# Patient Record
Sex: Male | Born: 1940 | Race: White | Hispanic: No | Marital: Married | Smoking: Never smoker
Health system: Southern US, Community
[De-identification: ages and names within clinical notes are randomized; demographics above are authoritative.]

## PROBLEM LIST (undated history)

## (undated) DIAGNOSIS — T4145XA Adverse effect of unspecified anesthetic, initial encounter: Secondary | ICD-10-CM

## (undated) DIAGNOSIS — Z952 Presence of prosthetic heart valve: Secondary | ICD-10-CM

## (undated) DIAGNOSIS — I350 Nonrheumatic aortic (valve) stenosis: Secondary | ICD-10-CM

## (undated) DIAGNOSIS — H269 Unspecified cataract: Secondary | ICD-10-CM

## (undated) DIAGNOSIS — T8859XA Other complications of anesthesia, initial encounter: Secondary | ICD-10-CM

## (undated) HISTORY — DX: Nonrheumatic aortic (valve) stenosis: I35.0

## (undated) HISTORY — DX: Unspecified cataract: H26.9

## (undated) HISTORY — PX: HERNIA REPAIR: SHX51

## (undated) HISTORY — PX: TONSILLECTOMY: SUR1361

---

## 1999-10-03 ENCOUNTER — Ambulatory Visit (HOSPITAL_BASED_OUTPATIENT_CLINIC_OR_DEPARTMENT_OTHER): Admission: RE | Admit: 1999-10-03 | Discharge: 1999-10-03 | Payer: Self-pay | Admitting: Plastic Surgery

## 2002-10-31 ENCOUNTER — Ambulatory Visit (HOSPITAL_BASED_OUTPATIENT_CLINIC_OR_DEPARTMENT_OTHER): Admission: RE | Admit: 2002-10-31 | Discharge: 2002-10-31 | Payer: Self-pay | Admitting: Plastic Surgery

## 2003-05-05 HISTORY — PX: COLONOSCOPY: SHX174

## 2013-05-04 HISTORY — PX: CATARACT EXTRACTION: SUR2

## 2013-05-04 HISTORY — PX: ELECTROCARDIOGRAM: SHX264

## 2013-08-28 ENCOUNTER — Other Ambulatory Visit (HOSPITAL_COMMUNITY): Payer: Self-pay | Admitting: Family Medicine

## 2013-08-28 ENCOUNTER — Encounter: Payer: Self-pay | Admitting: Internal Medicine

## 2013-08-28 ENCOUNTER — Ambulatory Visit (HOSPITAL_COMMUNITY): Payer: Medicare Other | Attending: Internal Medicine | Admitting: Cardiology

## 2013-08-28 DIAGNOSIS — I359 Nonrheumatic aortic valve disorder, unspecified: Secondary | ICD-10-CM | POA: Insufficient documentation

## 2013-08-28 NOTE — Progress Notes (Signed)
Echo performed. 

## 2014-10-03 ENCOUNTER — Encounter: Payer: Self-pay | Admitting: Gastroenterology

## 2014-10-22 ENCOUNTER — Ambulatory Visit (AMBULATORY_SURGERY_CENTER): Payer: Self-pay

## 2014-10-22 VITALS — Ht 67.0 in | Wt 153.0 lb

## 2014-10-22 DIAGNOSIS — Z1211 Encounter for screening for malignant neoplasm of colon: Secondary | ICD-10-CM

## 2014-10-22 NOTE — Progress Notes (Signed)
Previous colonoscopy 11 years ago. Patient doesn't recall MD's name. Patient would like to attempt colonoscopy without sedation, however he agrees to have IV started in case he decides to have sedation.

## 2014-10-26 ENCOUNTER — Encounter: Payer: Self-pay | Admitting: Gastroenterology

## 2014-11-02 ENCOUNTER — Ambulatory Visit (AMBULATORY_SURGERY_CENTER): Payer: Medicare Other | Admitting: Gastroenterology

## 2014-11-02 ENCOUNTER — Encounter: Payer: Self-pay | Admitting: Gastroenterology

## 2014-11-02 VITALS — BP 158/97 | HR 51 | Temp 97.0°F | Resp 33 | Ht 67.0 in | Wt 153.0 lb

## 2014-11-02 DIAGNOSIS — Z1211 Encounter for screening for malignant neoplasm of colon: Secondary | ICD-10-CM | POA: Diagnosis not present

## 2014-11-02 DIAGNOSIS — K635 Polyp of colon: Secondary | ICD-10-CM

## 2014-11-02 DIAGNOSIS — D123 Benign neoplasm of transverse colon: Secondary | ICD-10-CM | POA: Diagnosis not present

## 2014-11-02 MED ORDER — SODIUM CHLORIDE 0.9 % IV SOLN
500.0000 mL | INTRAVENOUS | Status: DC
Start: 2014-11-02 — End: 2014-11-02

## 2014-11-02 NOTE — Op Note (Signed)
[place]  [address] [place], [id number]   COLONOSCOPY PROCEDURE REPORT  PATIENT: Jared Norton, Jared Norton  MR#: #[id number] BIRTHDATE: February 14, 1941 , 78  yrs. old GENDER: male ENDOSCOPIST: Ladene Artist, MD, [facility] REFERRED BY:W.  Lutricia Feil, M.D. PROCEDURE DATE:  11/02/2014 PROCEDURE:   Colonoscopy, screening and Colonoscopy with biopsy First Screening Colonoscopy - Avg.  risk and is 50 yrs.  old or older - No.  Prior Negative Screening - Now for repeat screening. N/A  History of Adenoma - Now for follow-up colonoscopy & has been > or = to 3 yrs.  N/A  Polyps removed today? Yes ASA CLASS:   Class III INDICATIONS:Screening for colonic neoplasia and Colorectal Neoplasm Risk Assessment for this procedure is average risk. MEDICATIONS: none DESCRIPTION OF PROCEDURE:   After the risks benefits and alternatives of the procedure were thoroughly explained, informed consent was obtained.  The digital rectal exam revealed no abnormalities of the rectum.   The LB [id number]  endoscope was introduced through the anus and advanced to the cecum, which was identified by both the appendix and ileocecal valve. No adverse events experienced.   The quality of the prep was excellent. (MiraLax was used)  The instrument was then slowly withdrawn as the colon was fully examined. Estimated blood loss is zero unless otherwise noted in this procedure report.    COLON FINDINGS: Two sessile polyps measuring 4-5 mm in size were found in the transverse colon.  Polypectomies were performed with cold forceps.  The resection was complete, the polyp tissue was completely retrieved and sent to histology.   There was mild diverticulosis noted in the sigmoid colon.   The examination was otherwise normal.  Retroflexed views revealed no abnormalities. The time to cecum = 3.0 Withdrawal time = 15.5   The scope was withdrawn and the procedure completed. COMPLICATIONS: There were no immediate  complications.  ENDOSCOPIC IMPRESSION: 1.   Two sessile polyps in the transverse colon; polypectomies performed with cold forceps 2.   Mild diverticulosis in the sigmoid colon 3.   The examination was otherwise normal  RECOMMENDATIONS: 1.  Await pathology results 2.  Repeat colonoscopy in 5 years if polyp(s) adenomatous; otherwise, given your age, you will not need another colonoscopy for colon cancer screening or polyp surveillance.  These types of tests usually stop around the age 61. 3.  High fiber diet with liberal fluid intake.  eSigned:  Ladene Artist, MD, [facility] 11/02/2014 10:30 AM

## 2014-11-02 NOTE — Progress Notes (Signed)
Called to room to assist during endoscopic procedure.  Patient ID and intended procedure confirmed with present staff. Received instructions for my participation in the procedure from the performing physician.  

## 2014-11-02 NOTE — Progress Notes (Signed)
Report to PACU, RN, vss, BBS= Clear.  

## 2014-11-02 NOTE — Patient Instructions (Signed)
YOU HAD AN ENDOSCOPIC PROCEDURE TODAY AT THE [facility]:   Refer to the procedure report that was given to you for any specific questions about what was found during the examination.  If the procedure report does not answer your questions, please call your gastroenterologist to clarify.  If you requested that your care partner not be given the details of your procedure findings, then the procedure report has been included in a sealed envelope for you to review at your convenience later.  YOU SHOULD EXPECT: Some feelings of bloating in the abdomen. Passage of more gas than usual.  Walking can help get rid of the air that was put into your GI tract during the procedure and reduce the bloating. If you had a lower endoscopy (such as a colonoscopy or flexible sigmoidoscopy) you may notice spotting of blood in your stool or on the toilet paper. If you underwent a bowel prep for your procedure, you may not have a normal bowel movement for a few days.  Please Note:  You might notice some irritation and congestion in your nose or some drainage.  This is from the oxygen used during your procedure.  There is no need for concern and it should clear up in a day or so.  SYMPTOMS TO REPORT IMMEDIATELY:   Following lower endoscopy (colonoscopy or flexible sigmoidoscopy):  Excessive amounts of blood in the stool  Significant tenderness or worsening of abdominal pains  Swelling of the abdomen that is new, acute  Fever of 100F or higher   For urgent or emergent issues, a gastroenterologist can be reached at any hour by calling [phone].   DIET: Your first meal following the procedure should be a small meal and then it is ok to progress to your normal diet. Heavy or fried foods are harder to digest and may make you feel nauseous or bloated.  Likewise, meals heavy in dairy and vegetables can increase bloating.  Drink plenty of fluids but you should avoid alcoholic beverages for 24  hours.  ACTIVITY:  You should plan to take it easy for the rest of today and you should NOT DRIVE or use heavy machinery until tomorrow (because of the sedation medicines used during the test).    FOLLOW UP: Our staff will call the number listed on your records the next business day following your procedure to check on you and address any questions or concerns that you may have regarding the information given to you following your procedure. If we do not reach you, we will leave a message.  However, if you are feeling well and you are not experiencing any problems, there is no need to return our call.  We will assume that you have returned to your regular daily activities without incident.  If any biopsies were taken you will be contacted by phone or by letter within the next 1-3 weeks.  Please call us at [phone] if you have not heard about the biopsies in 3 weeks.    SIGNATURES/CONFIDENTIALITY: You and/or your care partner have signed paperwork which will be entered into your electronic medical record.  These signatures attest to the fact that that the information above on your After Visit Summary has been reviewed and is understood.  Full responsibility of the confidentiality of this discharge information lies with you and/or your care-partner.  Polyp, diverticulosis and high fiber diet information given. 

## 2014-11-06 ENCOUNTER — Telehealth: Payer: Self-pay | Admitting: *Deleted

## 2014-11-06 NOTE — Telephone Encounter (Signed)
  Follow up Call-  Call back number 11/02/2014  Post procedure Call Back phone  # [phone]  Permission to leave phone message Yes     Patient questions:  Do you have a fever, pain , or abdominal swelling? No. Pain Score  0 *  Have you tolerated food without any problems? Yes.    Have you been able to return to your normal activities? Yes.    Do you have any questions about your discharge instructions: Diet   No. Medications  No. Follow up visit  No.  Do you have questions or concerns about your Care? No.  Actions: * If pain score is 4 or above: No action needed, pain <4.

## 2014-11-11 ENCOUNTER — Encounter: Payer: Self-pay | Admitting: Gastroenterology

## 2015-12-05 ENCOUNTER — Ambulatory Visit (HOSPITAL_COMMUNITY): Payer: Medicare Other | Attending: Cardiology

## 2015-12-05 ENCOUNTER — Other Ambulatory Visit: Payer: Self-pay

## 2015-12-05 ENCOUNTER — Other Ambulatory Visit: Payer: Self-pay | Admitting: Internal Medicine

## 2015-12-05 DIAGNOSIS — I350 Nonrheumatic aortic (valve) stenosis: Secondary | ICD-10-CM

## 2015-12-05 DIAGNOSIS — I517 Cardiomegaly: Secondary | ICD-10-CM | POA: Insufficient documentation

## 2015-12-05 DIAGNOSIS — I352 Nonrheumatic aortic (valve) stenosis with insufficiency: Secondary | ICD-10-CM | POA: Insufficient documentation

## 2015-12-05 DIAGNOSIS — I5189 Other ill-defined heart diseases: Secondary | ICD-10-CM | POA: Insufficient documentation

## 2015-12-05 DIAGNOSIS — I059 Rheumatic mitral valve disease, unspecified: Secondary | ICD-10-CM | POA: Diagnosis not present

## 2015-12-16 ENCOUNTER — Ambulatory Visit: Payer: Self-pay | Admitting: General Surgery

## 2015-12-16 NOTE — H&P (Signed)
History of Present Illness Jared Norton; 12/16/2015 8:46 AM) Patient words: hernia.  The patient is a 75 year old male who presents with an inguinal hernia. The patient is a 75 year old male who is referred by Dr. Marton Redwood for evaluation of a left inguinal hernia. Patient states she's had these are for approximately 2 months. He states [place]. He has no discomfort in the area, he notices a bulge the left inguinal area. He states it does not interfere with his daily activities.  The patient did previously have a open right inguinal hernia as a child.   Other Problems Jared Norton; 12/16/2015 8:25 AM) General anesthesia - complications Heart murmur Hypercholesterolemia Inguinal Hernia  Past Surgical History Jared Norton; 12/16/2015 8:25 AM) Cataract Surgery Bilateral. Open Inguinal Hernia Surgery Right. Oral Surgery Tonsillectomy Vasectomy  Diagnostic Studies History Jared Sellers, [place]; 12/16/2015 8:25 AM) Colonoscopy within last year  Allergies Jared Norton; 12/16/2015 8:25 AM) No Known Drug Allergies08/14/2017  Medication History Jared Sellers, [place]; 12/16/2015 8:26 AM) Crestor (10MG  Tablet, Oral) Active. Multi Vitamin Daily (Oral) Active. Omega 3 (Oral) Specific dose unknown - Active. CoQ10 (100MG  Capsule, Oral) Active. Medications Reconciled  Social History Jared Sellers, [place]; 12/16/2015 8:25 AM) Alcohol use Moderate alcohol use. Caffeine use Coffee. No drug use Tobacco use Never smoker.  Family History Jared Sellers, [place]; 12/16/2015 8:25 AM) Heart Disease Mother. Respiratory Condition Father.    Review of Systems ([place]. Jared Norton; 12/16/2015 8:25 AM) General Not Present- Appetite Loss, Chills, Fatigue, Fever, Night Sweats, Weight Gain and Weight Loss. Skin Not Present- Change in Wart/Mole, Dryness, Hives, Jaundice, New Lesions, Non-Healing  Wounds, Rash and Ulcer. HEENT Not Present- Earache, Hearing Loss, Hoarseness, Nose Bleed, Oral Ulcers, Ringing in the Ears, Seasonal Allergies, Sinus Pain, Sore Throat, Visual Disturbances, Wears glasses/contact lenses and Yellow Eyes. Respiratory Not Present- Bloody sputum, Chronic Cough, Difficulty Breathing, Snoring and Wheezing. Breast Not Present- Breast Mass, Breast Pain, Nipple Discharge and Skin Changes. Cardiovascular Not Present- Chest Pain, Difficulty Breathing Lying Down, Leg Cramps, Palpitations, Rapid Heart Rate, Shortness of Breath and Swelling of Extremities. Gastrointestinal Not Present- Abdominal Pain, Bloating, Bloody Stool, Change in Bowel Habits, Chronic diarrhea, Constipation, Difficulty Swallowing, Excessive gas, Gets full quickly at meals, Hemorrhoids, Indigestion, Nausea, Rectal Pain and Vomiting. Male Genitourinary Not Present- Blood in Urine, Change in Urinary Stream, Frequency, Impotence, Nocturia, Painful Urination, Urgency and Urine Leakage. Musculoskeletal Not Present- Back Pain, Joint Pain, Joint Stiffness, Muscle Pain, Muscle Weakness and Swelling of Extremities. Neurological Not Present- Decreased Memory, Fainting, Headaches, Numbness, Seizures, Tingling, Tremor, Trouble walking and Weakness. Psychiatric Not Present- Anxiety, Bipolar, Change in Sleep Pattern, Depression, Fearful and Frequent crying. Endocrine Not Present- Cold Intolerance, Excessive Hunger, Hair Changes, Heat Intolerance, Hot flashes and New Diabetes. Hematology Not Present- Blood Thinners, Easy Bruising, Excessive bleeding, Gland problems, HIV and Persistent Infections.  Vitals [employer] R. Jared Norton; 12/16/2015 8:24 AM) 12/16/2015 8:24 AM Weight: 143.38 lb Height: 65in Body Surface Area: 1.72 m Body Mass Index: 23.86 kg/m  BP: 130/80 (Sitting, Left Arm, Standard)       Physical Exam Jared Norton; 12/16/2015 8:46 AM) General Mental Status-Alert. General  Appearance-Consistent with stated age. Hydration-Well hydrated. Voice-Normal.  Head and Neck Head-normocephalic, atraumatic with no lesions or palpable masses. Trachea-midline.  Eye Eyeball - Bilateral-Extraocular movements intact. Sclera/Conjunctiva - Bilateral-No scleral icterus.  Chest and Lung Exam Chest and lung exam reveals -quiet, even  and easy respiratory effort with no use of accessory muscles. Inspection Chest Wall - Normal. Back - normal.  Cardiovascular Cardiovascular examination reveals -normal heart sounds, regular rate and rhythm with no murmurs.  Abdomen Inspection Skin - Scar - no surgical scars. Hernias - Inguinal hernia - Left - Reducible(Likely indirect). Palpation/Percussion Normal exam - Soft, Non Tender, No Rebound tenderness, No Rigidity (guarding) and No hepatosplenomegaly. Auscultation Normal exam - Bowel sounds normal.  Neurologic Neurologic evaluation reveals -alert and oriented x 3 with no impairment of recent or remote memory. Mental Status-Normal.  Musculoskeletal Normal Exam - Left-Upper Extremity Strength Normal and Lower Extremity Strength Normal. Normal Exam - Right-Upper Extremity Strength Normal, Lower Extremity Weakness.    Assessment & Plan Jared Norton; 12/16/2015 8:47 AM) LEFT INGUINAL HERNIA (K40.90) Impression: 75 year old male with a left inguinal hernia.  1. The patient will like to proceed to the operating room for laparoscopic left inguinal hernia repair with mesh.  2. I discussed with the patient the signs and symptoms of incarceration and strangulation and the need to proceed to the ER should they occur.  3. I discussed with the patient the risks and benefits of the procedure to include but not limited to: Infection, bleeding, damage to surrounding structures, possible need for further surgery, possible nerve pain, and possible recurrence. The patient was understanding and wishes to  proceed.

## 2017-11-10 ENCOUNTER — Telehealth (HOSPITAL_COMMUNITY): Payer: Self-pay | Admitting: Internal Medicine

## 2017-11-15 NOTE — Telephone Encounter (Addendum)
User: Cherie Dark A Date/time: 11/15/17 11:25 AM  Comment: Called pt and lmsg for him to CB to get sch for an echo.Vassie Moment  Context:  Outcome: Left Message  Phone number: [phone] Phone Type: Home Phone  Comm. type: Telephone Call type: Outgoing  Contact: Electa Sniff. Relation to patient: Self    User: Cherie Dark A Date/time: 11/12/17 10:39 AM  Comment: Called pt and lmsg for him to CB to get sch for an echo.Vassie Moment  Context:  Outcome: Left Message  Phone number: [phone] Phone Type: Home Phone  Comm. type: Telephone Call type: Outgoing  Contact: Electa Sniff. Relation to patient: Self    User: Cherie Dark A Date/time: 11/11/17 11:07 AM  Comment: Called pt and lmsg for him to CB to get sch for an echo.  Context:  Outcome: Left Message  Phone number: [phone] Phone Type: Home Phone  Comm. type: Telephone Call type: Outgoing  Contact: Electa Sniff. Relation to patient: Self    User: Cherie Dark A Date/time: 11/10/17 10:17 AM  Comment: Called pt and lmsg for her to CB to get scheduled for echo.  Context:  Outcome: Left Message  Phone number: [phone] Phone Type: Home Phone  Comm. type: Telephone Call type: Outgoing  Contact: Electa Sniff. Relation to patient: Self   11/15/2017 11:29 AM Phone Lahoma Crocker) [place] (Provider) [phone] Remove  Left Message - Called Alyson and lmsg for her informing her that I have reached out to the patient 4 times and has yet to receive a response.     By Cherie Dark A         Close PreviousNext    Visit Information

## 2017-12-13 ENCOUNTER — Other Ambulatory Visit (HOSPITAL_COMMUNITY): Payer: Self-pay | Admitting: Internal Medicine

## 2017-12-13 ENCOUNTER — Ambulatory Visit (HOSPITAL_COMMUNITY): Payer: Medicare Other | Attending: Cardiology

## 2017-12-13 ENCOUNTER — Other Ambulatory Visit: Payer: Self-pay

## 2017-12-13 DIAGNOSIS — I371 Nonrheumatic pulmonary valve insufficiency: Secondary | ICD-10-CM | POA: Diagnosis not present

## 2017-12-13 DIAGNOSIS — I340 Nonrheumatic mitral (valve) insufficiency: Secondary | ICD-10-CM | POA: Diagnosis not present

## 2017-12-13 DIAGNOSIS — I272 Pulmonary hypertension, unspecified: Secondary | ICD-10-CM | POA: Insufficient documentation

## 2017-12-13 DIAGNOSIS — I350 Nonrheumatic aortic (valve) stenosis: Secondary | ICD-10-CM

## 2017-12-15 ENCOUNTER — Telehealth: Payer: Self-pay

## 2017-12-15 NOTE — Telephone Encounter (Signed)
I left the pt a message to contact me in regards to scheduling a new patient appointment with Dr Burt Knack to further evaluate aortic stenosis, Dr Brigitte Pulse is referring.

## 2017-12-15 NOTE — Telephone Encounter (Signed)
I spoke with the pt and scheduled him for evaluation with Dr Burt Knack on 8/21 at 10:00. Office visit note and recent labs have been requested from Dr Raul Del office.  Echo report is available in Epic.

## 2017-12-20 ENCOUNTER — Encounter: Payer: Self-pay | Admitting: Physician Assistant

## 2017-12-21 NOTE — H&P (View-Only) (Signed)
Cardiology Office Note Date:  12/22/2017   ID:  Jared Hermann., DOB [birth year], MRN [id number]  PCP:  Marton Redwood, MD  Cardiologist:  Sherren Mocha, MD    Chief Complaint  Patient presents with  . Aortic Stenosis     History of Present Illness: Jared Nibert. is a 77 y.o. male who presents for initial evaluation of severe aortic stenosis, referred by Dr Brigitte Pulse.   The patient is here with his wife today. He is a retired Adult nurse. He's known of a heart murmur for over 30 years. No history of rheumatic fever.  He is extremely active with regular exercise. He also follows a healthy diet and has lost weight over the past few years. He walks 4 miles every day at about a 15 minute mile pace. He also [place] the [place] with his [place] and can hike up to 6-8 miles. He does state that he 'conscious of his breath.' He feels like he can't walk at the same pace he could a few years ago and does admit to mild shortness of breath with physical exertion. He denies chest pressure or lightheadedness or presyncope, even with physical exertion. No edema, orthopnea, PND, or heart palpitations. Overall he feels well, but has become very concerned about his aortic valve disease and has done a lot of reading about his condition recently.  Past Medical History:  Diagnosis Date  . Cataract, bilateral   . Severe aortic stenosis     Past Surgical History:  Procedure Laterality Date  . CATARACT EXTRACTION Bilateral 2015  . COLONOSCOPY  2005   [place], [place] (unknown MD)  . ELECTROCARDIOGRAM  2015   murmur  . HERNIA REPAIR     childhood  . TONSILLECTOMY     Childhood    Current Outpatient Medications  Medication Sig Dispense Refill  . ampicillin (PRINCIPEN) 500 MG capsule TK ONE C PO BID  1  . hydrocortisone 2.5 % ointment APP AA ON THE SKIN BID PRN  0  . metroNIDAZOLE (METROGEL) 0.75 % gel APP TO FACE BID  2  . Multiple Vitamins-Minerals (MULTIVITAMIN WITH MINERALS)  tablet Take 1 tablet by mouth daily.    . Omega-3 Fatty Acids (OMEGA 3 PO) Take by mouth.     No current facility-administered medications for this visit.     Allergies:   Patient has no known allergies.   Social History:  The patient  reports that he has never smoked. He has never used smokeless tobacco. He reports that he drinks about 3.0 standard drinks of alcohol per week.   Family History:  The patient's family history is negative for premature CAD or valvular heart disease.  ROS:  Please see the history of present illness.  All other systems are reviewed and negative.   PHYSICAL EXAM: VS:  BP 116/72   Pulse (!) 47   Ht 5\' 7"  (1.702 m)   Wt 139 lb 12.8 oz (63.4 kg)   SpO2 98%   BMI 21.90 kg/m  , BMI Body mass index is 21.9 kg/m. GEN: Well nourished, well developed, in no acute distress  HEENT: normal  Neck: no JVD, no masses. BL carotid bruits Cardiac: bradycardiac and regular with 3/6 harsh late peaking systolic murmur at the RUSB, no diastolic murmur            Respiratory:  clear to auscultation bilaterally, normal work of breathing GI: soft, nontender, nondistended, + BS MS: no deformity or atrophy  Ext: no pretibial edema, pedal pulses 2+= bilaterally Skin: warm and dry, no rash Neuro:  Strength and sensation are intact Psych: euthymic mood, full affect  EKG:  EKG is ordered today. The ekg ordered today shows marked sinus bradycardia 40 bpm, moderate criteria for LVH  Recent Labs: No results found for requested labs within last 8760 hours.   Lipid Panel  No results found for: CHOL, TRIG, HDL, CHOLHDL, VLDL, LDLCALC, LDLDIRECT    Wt Readings from Last 3 Encounters:  12/22/17 139 lb 12.8 oz (63.4 kg)  11/02/14 153 lb (69.4 kg)  10/22/14 153 lb (69.4 kg)     Cardiac Studies Reviewed: Echo 12-13-2017: Indications:      Aortic Valve Disorder (I35.0).  ------------------------------------------------------------------- History:   PMH:    Murmur.  ------------------------------------------------------------------- Study Conclusions  - Left ventricle: The cavity size was normal. Wall thickness was   increased in a pattern of moderate LVH. Systolic function was   normal. The estimated ejection fraction was in the range of 55%   to 60%. Wall motion was normal; there were no regional wall   motion abnormalities. Features are consistent with a pseudonormal   left ventricular filling pattern, with concomitant abnormal   relaxation and increased filling pressure (grade 2 diastolic   dysfunction). - Aortic valve: Trileaflet; severely calcified leaflets. There was   severe stenosis. There was trivial regurgitation. Mean gradient   (S): 46 mm Hg. Peak gradient (S): 79 mm Hg. Valve area (VTI):   0.75 cm^2. - Mitral valve: Mildly calcified annulus. There was mild to   moderate regurgitation. - Left atrium: The atrium was severely dilated. - Right ventricle: The cavity size was normal. Systolic function   was normal. - Right atrium: The atrium was mildly dilated. - Tricuspid valve: Peak RV-RA gradient (S): 33 mm Hg. - Pulmonic valve: There was mild to moderate regurgitation. - Pulmonary arteries: PA peak pressure: 41 mm Hg (S). - Systemic veins: IVC measured 2.1 cm with > 50% respirophasic   variation, suggesting RA pressure 8 mmHg.  Impressions:  - Normal LV size with moderate LV hypertrophy. EF 55-60%. Moderate   diastolic dysfunction. Normal RV size and systolic function.   Severe aortic stenosis. Mild to moderate mitral regurgitation.   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  Left ventricle:  The cavity size was normal. Wall thickness was increased in a pattern of moderate LVH. Systolic function was normal. The estimated ejection fraction was in the range of 55% to 60%. Wall motion was normal; there were no regional wall motion abnormalities. Features are consistent with a  pseudonormal left ventricular filling pattern, with concomitant abnormal relaxation and increased filling pressure (grade 2 diastolic dysfunction).  ------------------------------------------------------------------- Aortic valve:   Trileaflet; severely calcified leaflets.  Doppler:  There was severe stenosis.   There was trivial regurgitation. VTI ratio of LVOT to aortic valve: 0.24. Valve area (VTI): 0.75 cm^2. Indexed valve area (VTI): 0.41 cm^2/m^2. Peak velocity ratio of LVOT to aortic valve: 0.2. Valve area (Vmax): 0.64 cm^2. Indexed valve area (Vmax): 0.35 cm^2/m^2. Mean velocity ratio of LVOT to aortic valve: 0.21. Valve area (Vmean): 0.66 cm^2. Indexed valve area (Vmean): 0.36 cm^2/m^2.    Mean gradient (S): 46 mm Hg. Peak gradient (S): 79 mm Hg.  ------------------------------------------------------------------- Aorta:  Aortic root: The aortic root was normal in size. Ascending aorta: The ascending aorta was normal in size.  ------------------------------------------------------------------- Mitral valve:   Mildly calcified annulus.  Doppler:   There was no  evidence for stenosis.   There was mild to moderate regurgitation.   Peak gradient (D): 2 mm Hg.  ------------------------------------------------------------------- Left atrium:  The atrium was severely dilated.  ------------------------------------------------------------------- Right ventricle:  The cavity size was normal. Systolic function was normal.  ------------------------------------------------------------------- Pulmonic valve:    Structurally normal valve.   Cusp separation was normal.  Doppler:  Transvalvular velocity was within the normal range. There was mild to moderate regurgitation.  ------------------------------------------------------------------- Tricuspid valve:   Doppler:  There was trivial regurgitation.   ------------------------------------------------------------------- Right  atrium:  The atrium was mildly dilated.  ------------------------------------------------------------------- Pericardium:  There was no pericardial effusion.  ------------------------------------------------------------------- Systemic veins:  IVC measured 2.1 cm with > 50% respirophasic variation, suggesting RA pressure 8 mmHg. Inferior vena cava: Diameter: 21 mm.  ------------------------------------------------------------------- Measurements   IVC                                      Value          Reference  ID                                       21    mm       ----------    Left ventricle                           Value          Reference  LV ID, ED, PLAX chordal          (L)     40.5  mm       43 - 52  LV ID, ES, PLAX chordal                  28    mm       23 - 38  LV fx shortening, PLAX chordal           31    %        >=29  LV PW thickness, ED                      17.4  mm       ----------  IVS/LV PW ratio, ED                      0.98           <=1.3  Stroke volume, 2D                        89    ml       ----------  Stroke volume/bsa, 2D                    49    ml/m^2   ----------  LV e&', lateral                           8.49  cm/s     ----------  LV E/e&', lateral                         8.92           ----------  LV e&', medial                            5.11  cm/s     ----------  LV E/e&', medial                          14.81          ----------  LV e&', average                           6.8   cm/s     ----------  LV E/e&', average                         11.13          ----------    Ventricular septum                       Value          Reference  IVS thickness, ED                        17    mm       ----------    LVOT                                     Value          Reference  LVOT ID, S                               20    mm       ----------  LVOT area                                3.14  cm^2     ----------  LVOT peak velocity, S                     90.9  cm/s     ----------  LVOT mean velocity, S                    67.6  cm/s     ----------  LVOT VTI, S                              28.3  cm       ----------    Aortic valve                             Value          Reference  Aortic valve peak velocity, S            444   cm/s     ----------  Aortic valve mean velocity, S            323   cm/s     ----------  Aortic valve VTI, S  118   cm       ----------  Aortic mean gradient, S                  46    mm Hg    ----------  Aortic peak gradient, S                  79    mm Hg    ----------  VTI ratio, LVOT/AV                       0.24           ----------  Aortic valve area, VTI                   0.75  cm^2     ----------  Aortic valve area/bsa, VTI               0.41  cm^2/m^2 ----------  Velocity ratio, peak, LVOT/AV            0.2            ----------  Aortic valve area, peak velocity         0.64  cm^2     ----------  Aortic valve area/bsa, peak              0.35  cm^2/m^2 ----------  velocity  Velocity ratio, mean, LVOT/AV            0.21           ----------  Aortic valve area, mean velocity         0.66  cm^2     ----------  Aortic valve area/bsa, mean              0.36  cm^2/m^2 ----------  velocity  Aortic regurg pressure half-time         653   ms       ----------    Aorta                                    Value          Reference  Aortic root ID, ED                       36    mm       ----------    Left atrium                              Value          Reference  LA ID, A-P, ES                           47    mm       ----------  LA ID/bsa, A-P                   (H)     2.59  cm/m^2   <=2.2  LA volume, S                             128   ml       ----------  LA volume/bsa, S  70.4  ml/m^2   ----------  LA volume, ES, 1-p A4C                   141   ml       ----------  LA volume/bsa, ES, 1-p A4C               77.6  ml/m^2   ----------  LA volume, ES, 1-p A2C                    113   ml       ----------  LA volume/bsa, ES, 1-p A2C               62.2  ml/m^2   ----------    Mitral valve                             Value          Reference  Mitral E-wave peak velocity              75.7  cm/s     ----------  Mitral A-wave peak velocity              51.4  cm/s     ----------  Mitral deceleration time         (H)     352   ms       150 - 230  Mitral peak gradient, D                  2     mm Hg    ----------  Mitral E/A ratio, peak                   1.5            ----------  Mitral regurg VTI, PISA                  258   cm       ----------  Mitral ERO, PISA                         0.05  cm^2     ----------  Mitral regurg volume, PISA               13    ml       ----------    Pulmonary arteries                       Value          Reference  PA pressure, S, DP               (H)     41    mm Hg    <=30    Tricuspid valve                          Value          Reference  Tricuspid regurg peak velocity           288   cm/s     ----------  Tricuspid peak RV-RA gradient            33    mm Hg    ----------  Tricuspid maximal regurg  288   cm/s     ----------  velocity, PISA    Right atrium                             Value          Reference  RA ID, S-I, ES, A4C              (H)     58.6  mm       34 - 49  RA area, ES, A4C                         17.8  cm^2     8.3 - 19.5  RA volume, ES, A/L                       45.4  ml       ----------  RA volume/bsa, ES, A/L                   25    ml/m^2   ----------    Systemic veins                           Value          Reference  Estimated CVP                            3     mm Hg    ----------    Right ventricle                          Value          Reference  TAPSE                                    28.2  mm       ----------  RV pressure, S, DP               (H)     36    mm Hg    <=30  RV s&', lateral, S                        15    cm/s     ----------  STS Risk Calculator: Procedure:  Isolated AVR   Risk of Mortality: 1.053%  Renal Failure: 0.951%  Permanent Stroke: 1.009%  Prolonged Ventilation: 3.569%  DSW Infection: 0.070%  Reoperation: 3.745%  Morbidity or Mortality: 6.915%  Short Length of Stay: 54.793%  Long Length of Stay: 3.163%   ASSESSMENT AND PLAN: 77 year-old male with severe, Stage D1, aortic stenosis. The patient has early symptoms of exertional dyspnea, NYHA functional class I. He notices mild shortness of breath with exercise when he compares to last year.   I have reviewed the natural history of aortic stenosis with the patient and his wife who is present today. We have discussed the limitations of medical therapy and the poor prognosis associated with symptomatic aortic stenosis. We have reviewed potential treatment options, including palliative medical therapy, conventional surgical aortic valve replacement, and transcatheter aortic valve replacement. We discussed treatment options in the context of the patient's  specific comorbid medical conditions. He understands that a series of tests will be performed to help determine which treatment option might be most favorable for him. We discussed low-risk TAVR data, expected valve longevity with bioprosthetic AVR, and pros and cons of both surgeries. As next steps in evaluation I have recommended R/L heart catheterization and CTA studies of the heart as well as the chest/abdomen/pelvis to determine whether TAVR is anatomically feasible. If he has any unfavorable anatomic features for TAVR, he would be a good candidate for surgical AVR and is open to that as an option.   I have reviewed the risks, indications, and alternatives to cardiac catheterization, possible angioplasty, and stenting with the patient. Risks include but are not limited to bleeding, infection, vascular injury, stroke, myocardial infection, arrhythmia, kidney injury, radiation-related injury in the case of prolonged fluoroscopy use, emergency  cardiac surgery, and death. The patient understands the risks of serious complication is 1-2 in 9791 with diagnostic cardiac cath and 1-2% or less with angioplasty/stenting.   After his studies are completed, he will undergo formal cardiac surgical evaluation as part of a multidisciplinary valve team approach to his care.   Current medicines are reviewed with the patient today.  The patient does not have concerns regarding medicines.  Labs/ tests ordered today include:  No orders of the defined types were placed in this encounter.   Disposition:   As above  Signed, Sherren Mocha, MD  12/22/2017 10:22 AM    [facility] [place], [place], [place]  [address] Phone: [phone]; Fax: [phone]

## 2017-12-21 NOTE — Progress Notes (Signed)
Cardiology Office Note Date:  12/22/2017   ID:  Jared Norton., DOB 1941-03-15, MRN [id number]  PCP:  Marton Redwood, MD  Cardiologist:  Sherren Mocha, MD    Chief Complaint  Patient presents with  . Aortic Stenosis     History of Present Illness: Jared Norton. is a 77 y.o. male who presents for initial evaluation of severe aortic stenosis, referred by Dr Brigitte Pulse.   The patient is here with his wife today. He is a retired Adult nurse. He's known of a heart murmur for over 30 years. No history of rheumatic fever.  He is extremely active with regular exercise. He also follows a healthy diet and has lost weight over the past few years. He walks 4 miles every day at about a 15 minute mile pace. He also [place] in the mountains with his dog and can hike up to 6-8 miles. He does state that he 'conscious of his breath.' He feels like he can't walk at the same pace he could a few years ago and does admit to mild shortness of breath with physical exertion. He denies chest pressure or lightheadedness or presyncope, even with physical exertion. No edema, orthopnea, PND, or heart palpitations. Overall he feels well, but has become very concerned about his aortic valve disease and has done a lot of reading about his condition recently.  Past Medical History:  Diagnosis Date  . Cataract, bilateral   . Severe aortic stenosis     Past Surgical History:  Procedure Laterality Date  . CATARACT EXTRACTION Bilateral 2015  . COLONOSCOPY  2005   [place], [place] (unknown MD)  . ELECTROCARDIOGRAM  2015   murmur  . HERNIA REPAIR     childhood  . TONSILLECTOMY     Childhood    Current Outpatient Medications  Medication Sig Dispense Refill  . ampicillin (PRINCIPEN) 500 MG capsule TK ONE C PO BID  1  . hydrocortisone 2.5 % ointment APP AA ON THE SKIN BID PRN  0  . metroNIDAZOLE (METROGEL) 0.75 % gel APP TO FACE BID  2  . Multiple Vitamins-Minerals (MULTIVITAMIN WITH MINERALS)  tablet Take 1 tablet by mouth daily.    . Omega-3 Fatty Acids (OMEGA 3 PO) Take by mouth.     No current facility-administered medications for this visit.     Allergies:   Patient has no known allergies.   Social History:  The patient  reports that he has never smoked. He has never used smokeless tobacco. He reports that he drinks about 3.0 standard drinks of alcohol per week.   Family History:  The patient's family history is negative for premature CAD or valvular heart disease.  ROS:  Please see the history of present illness.  All other systems are reviewed and negative.   PHYSICAL EXAM: VS:  BP 116/72   Pulse (!) 47   Ht 5\' 7"  (1.702 m)   Wt 139 lb 12.8 oz (63.4 kg)   SpO2 98%   BMI 21.90 kg/m  , BMI Body mass index is 21.9 kg/m. GEN: Well nourished, well developed, in no acute distress  HEENT: normal  Neck: no JVD, no masses. BL carotid bruits Cardiac: bradycardiac and regular with 3/6 harsh late peaking systolic murmur at the RUSB, no diastolic murmur            Respiratory:  clear to auscultation bilaterally, normal work of breathing GI: soft, nontender, nondistended, + BS MS: no deformity or atrophy  Ext: no pretibial edema, pedal pulses 2+= bilaterally Skin: warm and dry, no rash Neuro:  Strength and sensation are intact Psych: euthymic mood, full affect  EKG:  EKG is ordered today. The ekg ordered today shows marked sinus bradycardia 40 bpm, moderate criteria for LVH  Recent Labs: No results found for requested labs within last 8760 hours.   Lipid Panel  No results found for: CHOL, TRIG, HDL, CHOLHDL, VLDL, LDLCALC, LDLDIRECT    Wt Readings from Last 3 Encounters:  12/22/17 139 lb 12.8 oz (63.4 kg)  11/02/14 153 lb (69.4 kg)  10/22/14 153 lb (69.4 kg)     Cardiac Studies Reviewed: Echo 12-13-2017: Indications:      Aortic Valve Disorder (I35.0).  ------------------------------------------------------------------- History:   PMH:    Murmur.  ------------------------------------------------------------------- Study Conclusions  - Left ventricle: The cavity size was normal. Wall thickness was   increased in a pattern of moderate LVH. Systolic function was   normal. The estimated ejection fraction was in the range of 55%   to 60%. Wall motion was normal; there were no regional wall   motion abnormalities. Features are consistent with a pseudonormal   left ventricular filling pattern, with concomitant abnormal   relaxation and increased filling pressure (grade 2 diastolic   dysfunction). - Aortic valve: Trileaflet; severely calcified leaflets. There was   severe stenosis. There was trivial regurgitation. Mean gradient   (S): 46 mm Hg. Peak gradient (S): 79 mm Hg. Valve area (VTI):   0.75 cm^2. - Mitral valve: Mildly calcified annulus. There was mild to   moderate regurgitation. - Left atrium: The atrium was severely dilated. - Right ventricle: The cavity size was normal. Systolic function   was normal. - Right atrium: The atrium was mildly dilated. - Tricuspid valve: Peak RV-RA gradient (S): 33 mm Hg. - Pulmonic valve: There was mild to moderate regurgitation. - Pulmonary arteries: PA peak pressure: 41 mm Hg (S). - Systemic veins: IVC measured 2.1 cm with > 50% respirophasic   variation, suggesting RA pressure 8 mmHg.  Impressions:  - Normal LV size with moderate LV hypertrophy. EF 55-60%. Moderate   diastolic dysfunction. Normal RV size and systolic function.   Severe aortic stenosis. Mild to moderate mitral regurgitation.   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  Left ventricle:  The cavity size was normal. Wall thickness was increased in a pattern of moderate LVH. Systolic function was normal. The estimated ejection fraction was in the range of 55% to 60%. Wall motion was normal; there were no regional wall motion abnormalities. Features are consistent with a  pseudonormal left ventricular filling pattern, with concomitant abnormal relaxation and increased filling pressure (grade 2 diastolic dysfunction).  ------------------------------------------------------------------- Aortic valve:   Trileaflet; severely calcified leaflets.  Doppler:  There was severe stenosis.   There was trivial regurgitation. VTI ratio of LVOT to aortic valve: 0.24. Valve area (VTI): 0.75 cm^2. Indexed valve area (VTI): 0.41 cm^2/m^2. Peak velocity ratio of LVOT to aortic valve: 0.2. Valve area (Vmax): 0.64 cm^2. Indexed valve area (Vmax): 0.35 cm^2/m^2. Mean velocity ratio of LVOT to aortic valve: 0.21. Valve area (Vmean): 0.66 cm^2. Indexed valve area (Vmean): 0.36 cm^2/m^2.    Mean gradient (S): 46 mm Hg. Peak gradient (S): 79 mm Hg.  ------------------------------------------------------------------- Aorta:  Aortic root: The aortic root was normal in size. Ascending aorta: The ascending aorta was normal in size.  ------------------------------------------------------------------- Mitral valve:   Mildly calcified annulus.  Doppler:   There was no  evidence for stenosis.   There was mild to moderate regurgitation.   Peak gradient (D): 2 mm Hg.  ------------------------------------------------------------------- Left atrium:  The atrium was severely dilated.  ------------------------------------------------------------------- Right ventricle:  The cavity size was normal. Systolic function was normal.  ------------------------------------------------------------------- Pulmonic valve:    Structurally normal valve.   Cusp separation was normal.  Doppler:  Transvalvular velocity was within the normal range. There was mild to moderate regurgitation.  ------------------------------------------------------------------- Tricuspid valve:   Doppler:  There was trivial regurgitation.   ------------------------------------------------------------------- Right  atrium:  The atrium was mildly dilated.  ------------------------------------------------------------------- Pericardium:  There was no pericardial effusion.  ------------------------------------------------------------------- Systemic veins:  IVC measured 2.1 cm with > 50% respirophasic variation, suggesting RA pressure 8 mmHg. Inferior vena cava: Diameter: 21 mm.  ------------------------------------------------------------------- Measurements   IVC                                      Value          Reference  ID                                       21    mm       ----------    Left ventricle                           Value          Reference  LV ID, ED, PLAX chordal          (L)     40.5  mm       43 - 52  LV ID, ES, PLAX chordal                  28    mm       23 - 38  LV fx shortening, PLAX chordal           31    %        >=29  LV PW thickness, ED                      17.4  mm       ----------  IVS/LV PW ratio, ED                      0.98           <=1.3  Stroke volume, 2D                        89    ml       ----------  Stroke volume/bsa, 2D                    49    ml/m^2   ----------  LV e&', lateral                           8.49  cm/s     ----------  LV E/e&', lateral                         8.92           ----------  LV e&', medial                            5.11  cm/s     ----------  LV E/e&', medial                          14.81          ----------  LV e&', average                           6.8   cm/s     ----------  LV E/e&', average                         11.13          ----------    Ventricular septum                       Value          Reference  IVS thickness, ED                        17    mm       ----------    LVOT                                     Value          Reference  LVOT ID, S                               20    mm       ----------  LVOT area                                3.14  cm^2     ----------  LVOT peak velocity, S                     90.9  cm/s     ----------  LVOT mean velocity, S                    67.6  cm/s     ----------  LVOT VTI, S                              28.3  cm       ----------    Aortic valve                             Value          Reference  Aortic valve peak velocity, S            444   cm/s     ----------  Aortic valve mean velocity, S            323   cm/s     ----------  Aortic valve VTI, S  118   cm       ----------  Aortic mean gradient, S                  46    mm Hg    ----------  Aortic peak gradient, S                  79    mm Hg    ----------  VTI ratio, LVOT/AV                       0.24           ----------  Aortic valve area, VTI                   0.75  cm^2     ----------  Aortic valve area/bsa, VTI               0.41  cm^2/m^2 ----------  Velocity ratio, peak, LVOT/AV            0.2            ----------  Aortic valve area, peak velocity         0.64  cm^2     ----------  Aortic valve area/bsa, peak              0.35  cm^2/m^2 ----------  velocity  Velocity ratio, mean, LVOT/AV            0.21           ----------  Aortic valve area, mean velocity         0.66  cm^2     ----------  Aortic valve area/bsa, mean              0.36  cm^2/m^2 ----------  velocity  Aortic regurg pressure half-time         653   ms       ----------    Aorta                                    Value          Reference  Aortic root ID, ED                       36    mm       ----------    Left atrium                              Value          Reference  LA ID, A-P, ES                           47    mm       ----------  LA ID/bsa, A-P                   (H)     2.59  cm/m^2   <=2.2  LA volume, S                             128   ml       ----------  LA volume/bsa, S  70.4  ml/m^2   ----------  LA volume, ES, 1-p A4C                   141   ml       ----------  LA volume/bsa, ES, 1-p A4C               77.6  ml/m^2   ----------  LA volume, ES, 1-p A2C                    113   ml       ----------  LA volume/bsa, ES, 1-p A2C               62.2  ml/m^2   ----------    Mitral valve                             Value          Reference  Mitral E-wave peak velocity              75.7  cm/s     ----------  Mitral A-wave peak velocity              51.4  cm/s     ----------  Mitral deceleration time         (H)     352   ms       150 - 230  Mitral peak gradient, D                  2     mm Hg    ----------  Mitral E/A ratio, peak                   1.5            ----------  Mitral regurg VTI, PISA                  258   cm       ----------  Mitral ERO, PISA                         0.05  cm^2     ----------  Mitral regurg volume, PISA               13    ml       ----------    Pulmonary arteries                       Value          Reference  PA pressure, S, DP               (H)     41    mm Hg    <=30    Tricuspid valve                          Value          Reference  Tricuspid regurg peak velocity           288   cm/s     ----------  Tricuspid peak RV-RA gradient            33    mm Hg    ----------  Tricuspid maximal regurg  288   cm/s     ----------  velocity, PISA    Right atrium                             Value          Reference  RA ID, S-I, ES, A4C              (H)     58.6  mm       34 - 49  RA area, ES, A4C                         17.8  cm^2     8.3 - 19.5  RA volume, ES, A/L                       45.4  ml       ----------  RA volume/bsa, ES, A/L                   25    ml/m^2   ----------    Systemic veins                           Value          Reference  Estimated CVP                            3     mm Hg    ----------    Right ventricle                          Value          Reference  TAPSE                                    28.2  mm       ----------  RV pressure, S, DP               (H)     36    mm Hg    <=30  RV s&', lateral, S                        15    cm/s     ----------  STS Risk Calculator: Procedure:  Isolated AVR   Risk of Mortality: 1.053%  Renal Failure: 0.951%  Permanent Stroke: 1.009%  Prolonged Ventilation: 3.569%  DSW Infection: 0.070%  Reoperation: 3.745%  Morbidity or Mortality: 6.915%  Short Length of Stay: 54.793%  Long Length of Stay: 3.163%   ASSESSMENT AND PLAN: 77 year-old male with severe, Stage D1, aortic stenosis. The patient has early symptoms of exertional dyspnea, NYHA functional class I. He notices mild shortness of breath with exercise when he compares to last year.   I have reviewed the natural history of aortic stenosis with the patient and his wife who is present today. We have discussed the limitations of medical therapy and the poor prognosis associated with symptomatic aortic stenosis. We have reviewed potential treatment options, including palliative medical therapy, conventional surgical aortic valve replacement, and transcatheter aortic valve replacement. We discussed treatment options in the context of the patient's  specific comorbid medical conditions. He understands that a series of tests will be performed to help determine which treatment option might be most favorable for him. We discussed low-risk TAVR data, expected valve longevity with bioprosthetic AVR, and pros and cons of both surgeries. As next steps in evaluation I have recommended R/L heart catheterization and CTA studies of the heart as well as the chest/abdomen/pelvis to determine whether TAVR is anatomically feasible. If he has any unfavorable anatomic features for TAVR, he would be a good candidate for surgical AVR and is open to that as an option.   I have reviewed the risks, indications, and alternatives to cardiac catheterization, possible angioplasty, and stenting with the patient. Risks include but are not limited to bleeding, infection, vascular injury, stroke, myocardial infection, arrhythmia, kidney injury, radiation-related injury in the case of prolonged fluoroscopy use, emergency  cardiac surgery, and death. The patient understands the risks of serious complication is 1-2 in 4650 with diagnostic cardiac cath and 1-2% or less with angioplasty/stenting.   After his studies are completed, he will undergo formal cardiac surgical evaluation as part of a multidisciplinary valve team approach to his care.   Current medicines are reviewed with the patient today.  The patient does not have concerns regarding medicines.  Labs/ tests ordered today include:  No orders of the defined types were placed in this encounter.   Disposition:   As above  Signed, Sherren Mocha, MD  12/22/2017 10:22 AM    [facility] [place], [place], [place]  [address] Phone: [phone]; Fax: [phone]

## 2017-12-22 ENCOUNTER — Ambulatory Visit: Payer: Medicare Other | Admitting: Cardiovascular Disease

## 2017-12-22 ENCOUNTER — Encounter: Payer: Self-pay | Admitting: Physician Assistant

## 2017-12-22 ENCOUNTER — Encounter: Payer: Self-pay | Admitting: Cardiovascular Disease

## 2017-12-22 VITALS — BP 116/72 | HR 47 | Ht 67.0 in | Wt 139.8 lb

## 2017-12-22 DIAGNOSIS — I350 Nonrheumatic aortic (valve) stenosis: Secondary | ICD-10-CM | POA: Diagnosis not present

## 2017-12-22 LAB — CBC WITH DIFFERENTIAL/PLATELET
BASOS: 0 %
Basophils Absolute: 0 10*3/uL (ref 0.0–0.2)
EOS (ABSOLUTE): 0.1 10*3/uL (ref 0.0–0.4)
Eos: 1 %
Hematocrit: 40.8 % (ref 37.5–51.0)
Hemoglobin: 14.1 g/dL (ref 13.0–17.7)
IMMATURE GRANULOCYTES: 0 %
Immature Grans (Abs): 0 10*3/uL (ref 0.0–0.1)
Lymphocytes Absolute: 1.5 10*3/uL (ref 0.7–3.1)
Lymphs: 24 %
MCH: 34.4 pg — ABNORMAL HIGH (ref 26.6–33.0)
MCHC: 34.6 g/dL (ref 31.5–35.7)
MCV: 100 fL — AB (ref 79–97)
MONOS ABS: 0.6 10*3/uL (ref 0.1–0.9)
Monocytes: 11 %
NEUTROS PCT: 64 %
Neutrophils Absolute: 3.8 10*3/uL (ref 1.4–7.0)
PLATELETS: 155 10*3/uL (ref 150–450)
RBC: 4.1 x10E6/uL — ABNORMAL LOW (ref 4.14–5.80)
RDW: 13.7 % (ref 12.3–15.4)
WBC: 6 10*3/uL (ref 3.4–10.8)

## 2017-12-22 LAB — BASIC METABOLIC PANEL
BUN/Creatinine Ratio: 22 (ref 10–24)
BUN: 23 mg/dL (ref 8–27)
CO2: 24 mmol/L (ref 20–29)
Calcium: 9.2 mg/dL (ref 8.6–10.2)
Chloride: 99 mmol/L (ref 96–106)
Creatinine, Ser: 1.06 mg/dL (ref 0.76–1.27)
GFR calc Af Amer: 79 mL/min/{1.73_m2} (ref >59)
GFR, EST NON AFRICAN AMERICAN: 68 mL/min/{1.73_m2} (ref >59)
GLUCOSE: 101 mg/dL — AB (ref 65–99)
POTASSIUM: 4.5 mmol/L (ref 3.5–5.2)
SODIUM: 137 mmol/L (ref 134–144)

## 2017-12-22 NOTE — Patient Instructions (Addendum)
Medication Instructions:  Your provider recommends that you continue on your current medications as directed. Please refer to the Current Medication list given to you today.    Labwork: TODAY! BMET, CBC  Testing/Procedures: Your physician has requested that you have a cardiac catheterization. Cardiac catheterization is used to diagnose and/or treat various heart conditions. Doctors may recommend this procedure for a number of different reasons. The most common reason is to evaluate chest pain. Chest pain can be a symptom of coronary artery disease (CAD), and cardiac catheterization can show whether plaque is narrowing or blocking your heart's arteries. This procedure is also used to evaluate the valves, as well as measure the blood flow and oxygen levels in different parts of your heart. For further information please visit [facility]. Please follow instruction sheet, as given.  Follow-Up: You will be contacted to arrange your follow-up appointments!  Any Other Special Instructions Will Be Listed Below (If Applicable).    [facility] [place], [place] [address] Dept: [phone] Loc: [phone]  Nigil Braman.  12/22/2017  You are scheduled for a Cardiac Catheterization on Tuesday, August 27 with Dr. Sherren Mocha.  1. Please arrive at the [facility] (Main Entrance A) at [facility]: [address] [place], [place] [address] at 10:30 AM (This time is two hours before your procedure to ensure your preparation). Free valet parking service is available.   Special note: Every effort is made to have your procedure done on time. Please understand that emergencies sometimes delay scheduled procedures.  2. Diet: Do not eat solid foods after midnight.  The patient may have clear liquids until 5am upon the day of the procedure.  3. Labs: TODAY!  4. Medication  instructions in preparation for your procedure:  1) TAKE ASPIRIN 81 mg the morning of your catheterization.  2) You may take all your medications as directed with sips of water the morning of your procedure.  5. Plan for one night stay--bring personal belongings. 6. Bring a current list of your medications and current insurance cards. 7. You MUST have a responsible person to drive you home. 8. Someone MUST be with you the first 24 hours after you arrive home or your discharge will be delayed. 9. Please wear clothes that are easy to get on and off and wear slip-on shoes.  Thank you for allowing [place] to care for you!   -- [facility]

## 2017-12-23 ENCOUNTER — Encounter: Payer: Self-pay | Admitting: Physician Assistant

## 2017-12-24 ENCOUNTER — Telehealth: Payer: Self-pay | Admitting: Cardiovascular Disease

## 2017-12-24 NOTE — Telephone Encounter (Signed)
Reiterated to the patient to come prepared to stay for one night after his catheterization in case Dr. Burt Knack has to place a stent or complications occur, but he will hopefully be able to leave later the same day. Reiterated to him to make sure he has a responsible driver to take him home as well. He was grateful for call back.

## 2017-12-24 NOTE — Telephone Encounter (Signed)
Per pt call want to know whether he would be spending the night after his CAT on 12/28/17 please give him a call or tex back.

## 2017-12-27 ENCOUNTER — Telehealth: Payer: Self-pay | Admitting: *Deleted

## 2017-12-27 NOTE — Telephone Encounter (Signed)
Pt contacted pre-catheterization scheduled at [facility] for: Tuesday December 28, 2017 12:30 PM Verified arrival time and place: [facility] En[facility]ance A at: 10:30 AM  No solid food after midnight prior to cath, clear liquids until 5 AM day of procedure. Verified allergies in Epic   AM meds can be  taken pre-cath with sip of water including: ASA 81 mg  Confirmed patient has responsible person to drive home post procedure and for 24 hours after you arrive home: yes

## 2017-12-28 ENCOUNTER — Ambulatory Visit (HOSPITAL_COMMUNITY)
Admission: RE | Admit: 2017-12-28 | Discharge: 2017-12-28 | Disposition: A | Discharge status: Home / Self Care | Payer: Medicare Other | Source: Ambulatory Visit | Attending: Cardiovascular Disease | Admitting: Cardiovascular Disease

## 2017-12-28 ENCOUNTER — Encounter (HOSPITAL_COMMUNITY)
Admission: RE | Disposition: A | Discharge status: Home / Self Care | Payer: Self-pay | Source: Ambulatory Visit | Attending: Cardiovascular Disease

## 2017-12-28 ENCOUNTER — Other Ambulatory Visit: Payer: Self-pay

## 2017-12-28 DIAGNOSIS — I2510 Atherosclerotic heart disease of native coronary artery without angina pectoris: Secondary | ICD-10-CM | POA: Insufficient documentation

## 2017-12-28 DIAGNOSIS — I080 Rheumatic disorders of both mitral and aortic valves: Secondary | ICD-10-CM | POA: Insufficient documentation

## 2017-12-28 DIAGNOSIS — Z9842 Cataract extraction status, left eye: Secondary | ICD-10-CM | POA: Insufficient documentation

## 2017-12-28 DIAGNOSIS — Z9889 Other specified postprocedural states: Secondary | ICD-10-CM | POA: Insufficient documentation

## 2017-12-28 DIAGNOSIS — Z9841 Cataract extraction status, right eye: Secondary | ICD-10-CM | POA: Diagnosis not present

## 2017-12-28 DIAGNOSIS — I350 Nonrheumatic aortic (valve) stenosis: Secondary | ICD-10-CM

## 2017-12-28 HISTORY — PX: CORONARY ANGIOGRAPHY: CATH118303

## 2017-12-28 LAB — POCT I-STAT 3, ART BLOOD GAS (G3+)
BICARBONATE: 24.7 mmol/L (ref 20.0–28.0)
O2 SAT: 98 %
TCO2: 26 mmol/L (ref 22–32)
pCO2 arterial: 40.2 mmHg (ref 32.0–48.0)
pH, Arterial: 7.397 (ref 7.350–7.450)
pO2, Arterial: 101 mmHg (ref 83.0–108.0)

## 2017-12-28 LAB — POCT I-STAT 3, VENOUS BLOOD GAS (G3P V)
Acid-base deficit: 2 mmol/L (ref 0.0–2.0)
BICARBONATE: 22.8 mmol/L (ref 20.0–28.0)
O2 SAT: 73 %
PCO2 VEN: 38.8 mmHg — AB (ref 44.0–60.0)
TCO2: 24 mmol/L (ref 22–32)
pH, Ven: 7.377 (ref 7.250–7.430)
pO2, Ven: 39 mmHg (ref 32.0–45.0)

## 2017-12-28 SURGERY — CORONARY ANGIOGRAPHY (CATH LAB)
Anesthesia: LOCAL

## 2017-12-28 MED ORDER — ONDANSETRON HCL 4 MG/2ML IJ SOLN: 4.0000 mg | Freq: Four times a day (QID) | INTRAMUSCULAR | Status: DC | PRN

## 2017-12-28 MED ORDER — HEPARIN (PORCINE) IN NACL 1000-0.9 UT/500ML-% IV SOLN
INTRAVENOUS | Status: AC
  Filled 2017-12-28: qty 1000

## 2017-12-28 MED ORDER — ASPIRIN 81 MG PO CHEW: 81.0000 mg | CHEWABLE_TABLET | ORAL | Status: DC

## 2017-12-28 MED ORDER — HEPARIN (PORCINE) IN NACL 1000-0.9 UT/500ML-% IV SOLN
INTRAVENOUS | Status: DC | PRN
  Administered 2017-12-28 (×2): 500 mL

## 2017-12-28 MED ORDER — MIDAZOLAM HCL 2 MG/2ML IJ SOLN
INTRAMUSCULAR | Status: AC
  Filled 2017-12-28: qty 2

## 2017-12-28 MED ORDER — SODIUM CHLORIDE 0.9% FLUSH: 3.0000 mL | Freq: Two times a day (BID) | INTRAVENOUS | Status: DC

## 2017-12-28 MED ORDER — HEPARIN SODIUM (PORCINE) 1000 UNIT/ML IJ SOLN
INTRAMUSCULAR | Status: AC
  Filled 2017-12-28: qty 1

## 2017-12-28 MED ORDER — SODIUM CHLORIDE 0.9 % IV SOLN: 250.0000 mL | INTRAVENOUS | Status: DC | PRN

## 2017-12-28 MED ORDER — SODIUM CHLORIDE 0.9% FLUSH: 3.0000 mL | INTRAVENOUS | Status: DC | PRN

## 2017-12-28 MED ORDER — LIDOCAINE HCL (PF) 1 % IJ SOLN
INTRAMUSCULAR | Status: AC
  Filled 2017-12-28: qty 30

## 2017-12-28 MED ORDER — FENTANYL CITRATE (PF) 100 MCG/2ML IJ SOLN
INTRAMUSCULAR | Status: DC | PRN
  Administered 2017-12-28: 25 ug via INTRAVENOUS

## 2017-12-28 MED ORDER — HEPARIN SODIUM (PORCINE) 1000 UNIT/ML IJ SOLN
INTRAMUSCULAR | Status: DC | PRN
  Administered 2017-12-28: 3000 [IU] via INTRAVENOUS

## 2017-12-28 MED ORDER — SODIUM CHLORIDE 0.9 % WEIGHT BASED INFUSION
3.0000 mL/kg/h | INTRAVENOUS | Status: AC
  Administered 2017-12-28: 3 mL/kg/h via INTRAVENOUS

## 2017-12-28 MED ORDER — IOPAMIDOL (ISOVUE-370) INJECTION 76%
INTRAVENOUS | Status: DC | PRN
  Administered 2017-12-28: 50 mL via INTRA_ARTERIAL

## 2017-12-28 MED ORDER — SODIUM CHLORIDE 0.9 % WEIGHT BASED INFUSION: 1.0000 mL/kg/h | INTRAVENOUS | Status: DC

## 2017-12-28 MED ORDER — MIDAZOLAM HCL 2 MG/2ML IJ SOLN
INTRAMUSCULAR | Status: DC | PRN
  Administered 2017-12-28: 2 mg via INTRAVENOUS

## 2017-12-28 MED ORDER — OXYCODONE HCL 5 MG PO TABS: 5.0000 mg | ORAL_TABLET | ORAL | Status: DC | PRN

## 2017-12-28 MED ORDER — LIDOCAINE HCL (PF) 1 % IJ SOLN
INTRAMUSCULAR | Status: DC | PRN
  Administered 2017-12-28 (×2): 2 mL via SUBCUTANEOUS

## 2017-12-28 MED ORDER — VERAPAMIL HCL 2.5 MG/ML IV SOLN
INTRAVENOUS | Status: AC
  Filled 2017-12-28: qty 2

## 2017-12-28 MED ORDER — DIAZEPAM 5 MG PO TABS: 5.0000 mg | ORAL_TABLET | ORAL | Status: DC | PRN

## 2017-12-28 MED ORDER — VERAPAMIL HCL 2.5 MG/ML IV SOLN
INTRAVENOUS | Status: DC | PRN
  Administered 2017-12-28: 10 mL via INTRA_ARTERIAL

## 2017-12-28 MED ORDER — FENTANYL CITRATE (PF) 100 MCG/2ML IJ SOLN
INTRAMUSCULAR | Status: AC
  Filled 2017-12-28: qty 2

## 2017-12-28 MED ORDER — ACETAMINOPHEN 325 MG PO TABS: 650.0000 mg | ORAL_TABLET | ORAL | Status: DC | PRN

## 2017-12-28 MED ORDER — IOPAMIDOL (ISOVUE-370) INJECTION 76%
INTRAVENOUS | Status: AC
  Filled 2017-12-28: qty 100

## 2017-12-28 SURGICAL SUPPLY — 13 items
CATH 5FR JL3.5 JR4 ANG PIG MP (CATHETERS) ×2 IMPLANT
CATH BALLN WEDGE 5F 110CM (CATHETERS) ×2 IMPLANT
CATH INFINITI 5 FR AR1 MOD (CATHETERS) ×2 IMPLANT
DEVICE RAD COMP TR BAND LRG (VASCULAR PRODUCTS) ×2 IMPLANT
GLIDESHEATH SLEND SS 6F .021 (SHEATH) ×2 IMPLANT
GUIDEWIRE .025 260CM (WIRE) ×2 IMPLANT
GUIDEWIRE INQWIRE 1.5J.035X260 (WIRE) ×1 IMPLANT
INQWIRE 1.5J .035X260CM (WIRE) ×2
KIT HEART LEFT (KITS) ×2 IMPLANT
PACK CARDIAC CATHETERIZATION (CUSTOM PROCEDURE TRAY) ×2 IMPLANT
SHEATH GLIDE SLENDER 4/5FR (SHEATH) ×2 IMPLANT
TRANSDUCER W/STOPCOCK (MISCELLANEOUS) ×2 IMPLANT
TUBING CIL FLEX 10 FLL-RA (TUBING) ×2 IMPLANT

## 2017-12-28 NOTE — Progress Notes (Signed)
Pt ambulatory in halls, ambulatory to bathroom, tolerated well

## 2017-12-28 NOTE — Interval H&P Note (Signed)
History and Physical Interval Note:  12/28/2017 12:56 PM  Jared Norton.  has presented today for surgery, with the diagnosis of as  The various methods of treatment have been discussed with the patient and family. After consideration of risks, benefits and other options for treatment, the patient has consented to  Procedure(s): RIGHT/LEFT HEART CATH AND CORONARY ANGIOGRAPHY (N/A) as a surgical intervention .  The patient's history has been reviewed, patient examined, no change in status, stable for surgery.  I have reviewed the patient's chart and labs.  Questions were answered to the patient's satisfaction.     Sherren Mocha

## 2017-12-28 NOTE — Research (Signed)
CADFEM Informed Consent   Subject Name: Jared E Sammons Jr.  Subject met inclusion and exclusion criteria.  The informed consent form, study requirements and expectations were reviewed with the subject and questions and concerns were addressed prior to the signing of the consent form.  The subject verbalized understanding of the trail requirements.  The subject agreed to participate in the CADFEM trial and signed the informed consent.  The informed consent was obtained prior to performance of any protocol-specific procedures for the subject.  A copy of the signed informed consent was given to the subject and a copy was placed in the subject's medical record.  Tiara S Woodard 12/28/2017, 11:20 AM  

## 2017-12-28 NOTE — Discharge Instructions (Signed)

## 2017-12-29 ENCOUNTER — Encounter (HOSPITAL_COMMUNITY): Payer: Self-pay | Admitting: Cardiovascular Disease

## 2017-12-30 ENCOUNTER — Encounter (HOSPITAL_COMMUNITY): Payer: Self-pay

## 2017-12-30 ENCOUNTER — Ambulatory Visit (HOSPITAL_BASED_OUTPATIENT_CLINIC_OR_DEPARTMENT_OTHER)
Admission: RE | Admit: 2017-12-30 | Discharge: 2017-12-30 | Disposition: A | Discharge status: Home / Self Care | Payer: Medicare Other | Source: Ambulatory Visit | Attending: Cardiovascular Disease | Admitting: Cardiovascular Disease

## 2017-12-30 ENCOUNTER — Ambulatory Visit (HOSPITAL_COMMUNITY)
Admission: RE | Admit: 2017-12-30 | Discharge: 2017-12-30 | Disposition: A | Discharge status: Home / Self Care | Payer: Medicare Other | Source: Ambulatory Visit | Attending: Cardiovascular Disease | Admitting: Cardiovascular Disease

## 2017-12-30 ENCOUNTER — Ambulatory Visit (HOSPITAL_COMMUNITY): Payer: Medicare Other

## 2017-12-30 DIAGNOSIS — I350 Nonrheumatic aortic (valve) stenosis: Secondary | ICD-10-CM | POA: Diagnosis present

## 2017-12-30 DIAGNOSIS — I517 Cardiomegaly: Secondary | ICD-10-CM | POA: Diagnosis not present

## 2017-12-30 DIAGNOSIS — I700 Atherosclerosis of aorta: Secondary | ICD-10-CM | POA: Diagnosis not present

## 2017-12-30 DIAGNOSIS — I2510 Atherosclerotic heart disease of native coronary artery without angina pectoris: Secondary | ICD-10-CM | POA: Diagnosis not present

## 2017-12-30 LAB — PULMONARY FUNCTION TEST
DL/VA % PRED: 108 %
DL/VA: 4.69 ml/min/mmHg/L
DLCO COR % PRED: 92 %
DLCO COR: 25.03 ml/min/mmHg
DLCO unc % pred: 91 %
DLCO unc: 24.67 ml/min/mmHg
FEF 25-75 POST: 2.47 L/s
FEF 25-75 Pre: 2.1 L/sec
FEF2575-%CHANGE-POST: 17 %
FEF2575-%PRED-PRE: 113 %
FEF2575-%Pred-Post: 133 %
FEV1-%Change-Post: 2 %
FEV1-%Pred-Post: 107 %
FEV1-%Pred-Pre: 105 %
FEV1-Post: 2.76 L
FEV1-Pre: 2.7 L
FEV1FVC-%CHANGE-POST: 14 %
FEV1FVC-%Pred-Pre: 103 %
FEV6-%Change-Post: -11 %
FEV6-%PRED-PRE: 106 %
FEV6-%Pred-Post: 94 %
FEV6-Post: 3.14 L
FEV6-Pre: 3.55 L
FEV6FVC-%Change-Post: 0 %
FEV6FVC-%Pred-Post: 106 %
FEV6FVC-%Pred-Pre: 107 %
FVC-%Change-Post: -10 %
FVC-%PRED-POST: 89 %
FVC-%Pred-Pre: 99 %
FVC-POST: 3.18 L
FVC-PRE: 3.56 L
POST FEV6/FVC RATIO: 99 %
PRE FEV1/FVC RATIO: 76 %
Post FEV1/FVC ratio: 87 %
Pre FEV6/FVC Ratio: 100 %
RV % pred: 97 %
RV: 2.29 L
TLC % pred: 97 %
TLC: 6.09 L

## 2017-12-30 MED ORDER — IOPAMIDOL (ISOVUE-370) INJECTION 76%
INTRAVENOUS | Status: AC
  Administered 2017-12-30: 95 mL
  Filled 2017-12-30: qty 100

## 2017-12-30 MED ORDER — ALBUTEROL SULFATE (2.5 MG/3ML) 0.083% IN NEBU
2.5000 mg | INHALATION_SOLUTION | Freq: Once | RESPIRATORY_TRACT | Status: AC
  Administered 2017-12-30: 2.5 mg via RESPIRATORY_TRACT

## 2017-12-30 NOTE — Progress Notes (Signed)
Carotid artery duplex has been completed. 1-39% ICA stenosis bilaterally.  12/30/17 11:01 AM Jared Norton RVT

## 2018-01-05 ENCOUNTER — Encounter: Payer: Medicare Other | Admitting: Surgery

## 2018-01-11 ENCOUNTER — Encounter: Payer: Self-pay | Admitting: Physical Therapy

## 2018-01-11 ENCOUNTER — Ambulatory Visit: Payer: Medicare Other | Attending: Cardiovascular Disease | Admitting: Physical Therapy

## 2018-01-11 ENCOUNTER — Institutional Professional Consult (permissible substitution): Payer: Medicare Other | Admitting: Thoracic Surgery (Cardiothoracic Vascular Surgery)

## 2018-01-11 ENCOUNTER — Other Ambulatory Visit: Payer: Self-pay

## 2018-01-11 ENCOUNTER — Encounter: Payer: Self-pay | Admitting: Thoracic Surgery (Cardiothoracic Vascular Surgery)

## 2018-01-11 VITALS — BP 140/70 | HR 48 | Resp 16 | Ht 67.0 in | Wt 137.0 lb

## 2018-01-11 DIAGNOSIS — R2689 Other abnormalities of gait and mobility: Secondary | ICD-10-CM

## 2018-01-11 DIAGNOSIS — I350 Nonrheumatic aortic (valve) stenosis: Secondary | ICD-10-CM

## 2018-01-11 NOTE — Progress Notes (Signed)
HE[facility] CONSULTATION REPORT  Referring Provider is Sherren Mocha, MD PCP is Marton Redwood, MD  Chief Complaint  Patient presents with  . Aortic Stenosis    surgical eval for TAVR    HPI:  Patient is a 77 year old male who has been referred for surgical consultation to discuss treatment options for management of severe symptomatic aortic stenosis.  Patient states that he has known of presence of a heart murmur for many years.  He has been remarkably healthy and physically active all of his life, and he has been followed carefully by his primary care physician.  Patient has remained quite active physically, although he admits that over the past year or so he has noticed a mild decrease in his exercise tolerance with mild pressure-like discomfort and exertional shortness of breath with more strenuous exertion.  The patient walks 4 miles every day including walking up and [place] hills in the mountains.  He states that he has developed a tendency to slow down and he has found that he has to "push himself through" to finish his height sometimes.  He otherwise feels fine and he denies any symptoms of shortness of breath or chest discomfort with low-level activity or at rest.  He is never had any palpitations, dizzy spells, nor syncope.  He denies any orthopnea or lower extremity edema.    Recent follow-up echocardiogram revealed the presence of severe aortic stenosis with preserved left ventricular systolic function.  The patient was subsequently referred to the multidisciplinary heart valve clinic and has been evaluated previously by Dr. Burt Knack.  He underwent diagnostic cardiac catheterization on December 28, 2017 and was found to have mild nonobstructive coronary artery disease with normal right heart pressures.  CT angiography was performed and cardiothoracic surgical consultation requested.  Patient is married and lives  in [place] with his wife.  They spend a fair amount of time in the mountains and enjoys hiking on a daily basis.  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  He remains quite active physically and reports no physical limitations whatsoever other than that as described previously.  Past Medical History:  Diagnosis Date  . Cataract, bilateral   . Severe aortic stenosis     Past Surgical History:  Procedure Laterality Date  . CATARACT EXTRACTION Bilateral 2015  . COLONOSCOPY  2005   [place], [place] (unknown MD)  . CORONARY ANGIOGRAPHY N/A 12/28/2017   Procedure: CORONARY ANGIOGRAPHY (CATH LAB);  Surgeon: Sherren Mocha, MD;  Location: [facility];  Service: Cardiovascular;  Laterality: N/A;  . ELECTROCARDIOGRAM  2015   murmur  . HERNIA REPAIR     childhood  . TONSILLECTOMY     Childhood    Family History  Problem Relation Age of Onset  . Colon cancer Neg Hx   . Rectal cancer Neg Hx   . Stomach cancer Neg Hx     Social History   Socioeconomic History  . Marital status: Married    Spouse name: Not on file  . Number of children: Not on file  . Years of education: Not on file  . Highest education level: Not on file  Occupational History  . Not on file  Social Needs  . Financial resource strain: Not on file  . Food insecurity:    Worry: Not on file    Inability: Not on  file  . Transportation needs:    Medical: Not on file    Non-medical: Not on file  Tobacco Use  . Smoking status: Never Smoker  . Smokeless tobacco: Never Used  Substance and Sexual Activity  . Alcohol use: Yes    Alcohol/week: 3.0 standard drinks    Types: 3 Standard drinks or equivalent per week  . Drug use: Not on file  . Sexual activity: Not on file  Lifestyle  . Physical activity:    Days per week: Not on file    Minutes per session: Not on file  . Stress: Not on file  Relationships  . Social  connections:    Talks on phone: Not on file    Gets together: Not on file    Attends religious service: Not on file    Active member of club or organization: Not on file    Attends meetings of clubs or organizations: Not on file    Relationship status: Not on file  . Intimate partner violence:    Fear of current or ex partner: Not on file    Emotionally abused: Not on file    Physically abused: Not on file    Forced sexual activity: Not on file  Other Topics Concern  . Not on file  Social History Narrative  . Not on file    Current Outpatient Medications  Medication Sig Dispense Refill  . Coenzyme Q10 (COQ10) 100 MG CAPS Take 100 mg by mouth daily.    . hydrocortisone 2.5 % ointment Apply 1 application topically 2 (two) times daily.   0  . metroNIDAZOLE (METROGEL) 0.75 % gel Apply 1 application topically 2 (two) times daily.   2  . Multiple Vitamins-Minerals (MULTIVITAMIN WITH MINERALS) tablet Take 1 tablet by mouth daily.    . Omega-3 Fatty Acids (OMEGA 3 PO) Take 1 capsule by mouth daily.      No current facility-administered medications for this visit.     Allergies  Allergen Reactions  . Ether Nausea Only    Ether used as anesthesia when he was a child      Review of Systems:   General:  normal appetite, normal energy, no weight gain, no weight loss, no fever  Cardiac:  + mild chest pain with exertion, no chest pain at rest, + SOB with strenuous exertion, no resting SOB, no PND, no orthopnea, no palpitations, no arrhythmia, no atrial fibrillation, no LE edema, no dizzy spells, no syncope  Respiratory:  no shortness of breath, no home oxygen, no productive cough, no dry cough, no bronchitis, no wheezing, no hemoptysis, no asthma, no pain with inspiration or cough, no sleep apnea, no CPAP at night  GI:   no difficulty swallowing, no reflux, no frequent heartburn, no hiatal hernia, no abdominal pain, no constipation, no diarrhea, no hematochezia, no hematemesis, no  melena  GU:   no dysuria,  no frequency, no urinary tract infection, no hematuria, no enlarged prostate, no kidney stones, no kidney disease  Vascular:  no pain suggestive of claudication, no pain in feet, no leg cramps, no varicose veins, no DVT, no non-healing foot ulcer  Neuro:   no stroke, no TIA's, no seizures, no headaches, no temporary blindness one eye,  no slurred speech, no peripheral neuropathy, no chronic pain, no instability of gait, no memory/cognitive dysfunction  Musculoskeletal: no arthritis, no joint swelling, no myalgias, no difficulty walking, normal mobility   Skin:   + rash, + itching, + skin infections, no  pressure sores or ulcerations  Psych:   no anxiety, no depression, no nervousness, no unusual recent stress  Eyes:   no blurry vision, no floaters, no recent vision changes, no wears glasses or contacts  ENT:   no hearing loss, no loose or painful teeth, no dentures, last saw dentist within the past year  Hematologic:  no easy bruising, no abnormal bleeding, no clotting disorder, no frequent epistaxis  Endocrine:  no diabetes, does not check CBG's at home           Physical Exam:   BP 140/70 (BP Location: Right Arm, Patient Position: Sitting, Cuff Size: Normal) Comment (Cuff Size): MANUALLY  Pulse (!) 48   Resp 16   Ht 5\' 7"  (1.702 m)   Wt 137 lb (62.1 kg)   SpO2 96%   BMI 21.46 kg/m   General:  Thin,  well-appearing  HEENT:  Unremarkable   Neck:   no JVD, no bruits, no adenopathy   Chest:   clear to auscultation, symmetrical breath sounds, no wheezes, no rhonchi   CV:   RRR, grade III/VI crescendo/decrescendo murmur heard best at RSB,  no diastolic murmur  Abdomen:  soft, non-tender, no masses   Extremities:  warm, well-perfused, pulses palpable, no LE edema  Rectal/GU  Deferred  Neuro:   Grossly non-focal and symmetrical throughout  Skin:   Clean and dry, no rashes, no breakdown   Diagnostic Tests:  Transthoracic Echocardiography  Patient:     Jared Norton, Jared Norton MR #:       [id number] Study Date: 12/13/2017 Gender:     M Age:        77 Height:     170.2 cm Weight:     69.4 kg BSA:        1.82 m^2 Pt. Status: Room:   Lieutenant [facility]  ATTENDING    Loralie Champagne, M.D.  SONOGRAPHER  Marygrace Drought, RCS  PERFORMING   [facility], Outpatient  cc:  ------------------------------------------------------------------- LV EF: 55% -   60%  ------------------------------------------------------------------- Indications:      Aortic Valve Disorder (I35.0).  ------------------------------------------------------------------- History:   PMH:   Murmur.  ------------------------------------------------------------------- Study Conclusions  - Left ventricle: The cavity size was normal. Wall thickness was   increased in a pattern of moderate LVH. Systolic function was   normal. The estimated ejection fraction was in the range of 55%   to 60%. Wall motion was normal; there were no regional wall   motion abnormalities. Features are consistent with a pseudonormal   left ventricular filling pattern, with concomitant abnormal   relaxation and increased filling pressure (grade 2 diastolic   dysfunction). - Aortic valve: Trileaflet; severely calcified leaflets. There was   severe stenosis. There was trivial regurgitation. Mean gradient   (S): 46 mm Hg. Peak gradient (S): 79 mm Hg. Valve area (VTI):   0.75 cm^2. - Mitral valve: Mildly calcified annulus. There was mild to   moderate regurgitation. - Left atrium: The atrium was severely dilated. - Right ventricle: The cavity size was normal. Systolic function   was normal. - Right atrium: The atrium was mildly dilated. - Tricuspid valve: Peak RV-RA gradient (S): 33 mm Hg. - Pulmonic valve: There was mild to moderate regurgitation. - Pulmonary arteries: PA peak pressure: 41 mm Hg (S). - Systemic veins: IVC measured 2.1 cm with > 50% respirophasic    variation, suggesting RA pressure 8 mmHg.  Impressions:  -  Normal LV size with moderate LV hypertrophy. EF 55-60%. Moderate   diastolic dysfunction. Normal RV size and systolic function.   Severe aortic stenosis. Mild to moderate mitral regurgitation.   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  ------------------------------------------------------------------- Study data:  Comparison was made to the study of 12/05/2015.  Study status:  Routine.  Procedure:  The patient reported no pain pre or post test. Transthoracic echocardiography. Image quality was adequate.          Transthoracic echocardiography.  M-mode, complete 2D, spectral Doppler, and color Doppler.  Birthdate: Patient birthdate: Nov 23, 1940.  Age:  Patient is 77 yr old.  Sex: Gender: male.    BMI: 24 kg/m^2.  Blood pressure:     168/84 Patient status:  Outpatient.  Study date:  Study date: 12/13/2017. Study time: 07:50 AM.  Location:  [facility]  -------------------------------------------------------------------  ------------------------------------------------------------------- Left ventricle:  The cavity size was normal. Wall thickness was increased in a pattern of moderate LVH. Systolic function was normal. The estimated ejection fraction was in the range of 55% to 60%. Wall motion was normal; there were no regional wall motion abnormalities. Features are consistent with a pseudonormal left ventricular filling pattern, with concomitant abnormal relaxation and increased filling pressure (grade 2 diastolic dysfunction).  ------------------------------------------------------------------- Aortic valve:   Trileaflet; severely calcified leaflets.  Doppler:  There was severe stenosis.   There was trivial regurgitation. VTI ratio of LVOT to aortic valve: 0.24. Valve area (VTI): 0.75 cm^2. Indexed valve area (VTI): 0.41 cm^2/m^2. Peak velocity ratio of LVOT to aortic valve:  0.2. Valve area (Vmax): 0.64 cm^2. Indexed valve area (Vmax): 0.35 cm^2/m^2. Mean velocity ratio of LVOT to aortic valve: 0.21. Valve area (Vmean): 0.66 cm^2. Indexed valve area (Vmean): 0.36 cm^2/m^2.    Mean gradient (S): 46 mm Hg. Peak gradient (S): 79 mm Hg.  ------------------------------------------------------------------- Aorta:  Aortic root: The aortic root was normal in size. Ascending aorta: The ascending aorta was normal in size.  ------------------------------------------------------------------- Mitral valve:   Mildly calcified annulus.  Doppler:   There was no evidence for stenosis.   There was mild to moderate regurgitation.   Peak gradient (D): 2 mm Hg.  ------------------------------------------------------------------- Left atrium:  The atrium was severely dilated.  ------------------------------------------------------------------- Right ventricle:  The cavity size was normal. Systolic function was normal.  ------------------------------------------------------------------- Pulmonic valve:    Structurally normal valve.   Cusp separation was normal.  Doppler:  Transvalvular velocity was within the normal range. There was mild to moderate regurgitation.  ------------------------------------------------------------------- Tricuspid valve:   Doppler:  There was trivial regurgitation.   ------------------------------------------------------------------- Right atrium:  The atrium was mildly dilated.  ------------------------------------------------------------------- Pericardium:  There was no pericardial effusion.  ------------------------------------------------------------------- Systemic veins:  IVC measured 2.1 cm with > 50% respirophasic variation, suggesting RA pressure 8 mmHg. Inferior vena cava: Diameter: 21 mm.  ------------------------------------------------------------------- Measurements   IVC                                      Value           Reference  ID                                       21    mm       ----------  Left ventricle                           Value          Reference  LV ID, ED, PLAX chordal          (L)     40.5  mm       43 - 52  LV ID, ES, PLAX chordal                  28    mm       23 - 38  LV fx shortening, PLAX chordal           31    %        >=29  LV PW thickness, ED                      17.4  mm       ----------  IVS/LV PW ratio, ED                      0.98           <=1.3  Stroke volume, 2D                        89    ml       ----------  Stroke volume/bsa, 2D                    49    ml/m^2   ----------  LV e&', lateral                           8.49  cm/s     ----------  LV E/e&', lateral                         8.92           ----------  LV e&', medial                            5.11  cm/s     ----------  LV E/e&', medial                          14.81          ----------  LV e&', average                           6.8   cm/s     ----------  LV E/e&', average                         11.13          ----------    Ventricular septum                       Value          Reference  IVS thickness, ED                        17    mm       ----------    LVOT  Value          Reference  LVOT ID, S                               20    mm       ----------  LVOT area                                3.14  cm^2     ----------  LVOT peak velocity, S                    90.9  cm/s     ----------  LVOT mean velocity, S                    67.6  cm/s     ----------  LVOT VTI, S                              28.3  cm       ----------    Aortic valve                             Value          Reference  Aortic valve peak velocity, S            444   cm/s     ----------  Aortic valve mean velocity, S            323   cm/s     ----------  Aortic valve VTI, S                      118   cm       ----------  Aortic mean gradient, S                  46    mm Hg    ----------   Aortic peak gradient, S                  79    mm Hg    ----------  VTI ratio, LVOT/AV                       0.24           ----------  Aortic valve area, VTI                   0.75  cm^2     ----------  Aortic valve area/bsa, VTI               0.41  cm^2/m^2 ----------  Velocity ratio, peak, LVOT/AV            0.2            ----------  Aortic valve area, peak velocity         0.64  cm^2     ----------  Aortic valve area/bsa, peak              0.35  cm^2/m^2 ----------  velocity  Velocity ratio, mean, LVOT/AV            0.21           ----------  Aortic valve area, mean velocity         0.66  cm^2     ----------  Aortic valve area/bsa, mean              0.36  cm^2/m^2 ----------  velocity  Aortic regurg pressure half-time         653   ms       ----------    Aorta                                    Value          Reference  Aortic root ID, ED                       36    mm       ----------    Left atrium                              Value          Reference  LA ID, A-P, ES                           47    mm       ----------  LA ID/bsa, A-P                   (H)     2.59  cm/m^2   <=2.2  LA volume, S                             128   ml       ----------  LA volume/bsa, S                         70.4  ml/m^2   ----------  LA volume, ES, 1-p A4C                   141   ml       ----------  LA volume/bsa, ES, 1-p A4C               77.6  ml/m^2   ----------  LA volume, ES, 1-p A2C                   113   ml       ----------  LA volume/bsa, ES, 1-p A2C               62.2  ml/m^2   ----------    Mitral valve                             Value          Reference  Mitral E-wave peak velocity              75.7  cm/s     ----------  Mitral A-wave peak velocity              51.4  cm/s     ----------  Mitral deceleration time         (H)     352   ms  150 - 230  Mitral peak gradient, D                  2     mm Hg    ----------  Mitral E/A ratio, peak                   1.5             ----------  Mitral regurg VTI, PISA                  258   cm       ----------  Mitral ERO, PISA                         0.05  cm^2     ----------  Mitral regurg volume, PISA               13    ml       ----------    Pulmonary arteries                       Value          Reference  PA pressure, S, DP               (H)     41    mm Hg    <=30    Tricuspid valve                          Value          Reference  Tricuspid regurg peak velocity           288   cm/s     ----------  Tricuspid peak RV-RA gradient            33    mm Hg    ----------  Tricuspid maximal regurg                 288   cm/s     ----------  velocity, PISA    Right atrium                             Value          Reference  RA ID, S-I, ES, A4C              (H)     58.6  mm       34 - 49  RA area, ES, A4C                         17.8  cm^2     8.3 - 19.5  RA volume, ES, A/L                       45.4  ml       ----------  RA volume/bsa, ES, A/L                   25    ml/m^2   ----------    Systemic veins                           Value          Reference  Estimated CVP  3     mm Hg    ----------    Right ventricle                          Value          Reference  TAPSE                                    28.2  mm       ----------  RV pressure, S, DP               (H)     36    mm Hg    <=30  RV s&', lateral, S                        15    cm/s     ----------  Legend: (L)  and  (H)  mark values outside specified reference range.  ------------------------------------------------------------------- Prepared and Electronically Authenticated by  Loralie Champagne, M.D. 2019-08-12T13:42:46  CORONARY ANGIOGRAPHY (CATH LAB)  Conclusion     Ost LM lesion is 25% stenosed.  Ost LAD lesion is 30% stenosed.  There is severe aortic valve stenosis.   1.  Patent coronary arteries with mild nonobstructive stenosis at the ostium of the LAD, otherwise minimal luminal irregularities 2.   Known severe aortic stenosis by noninvasive assessment with bulky calcification of the aortic valve seen on plain fluoroscopy and severely limited leaflet mobility. 3.  Normal right heart pressures and cardiac output by invasive assessment.  Recommendations: Continued multidisciplinary heart valve team assessment for treatment of severe aortic stenosis.     Indications   Severe aortic stenosis [I35.0 (ICD-10-CM)]  Procedural Details/Technique   Technical Details INDICATION: Severe symptomatic aortic stenosis  PROCEDURAL DETAILS: There was an indwelling IV in a right antecubital vein. Using normal sterile technique, the IV was changed out for a 5 Fr brachial sheath over a 0.018 inch wire. The right wrist was then prepped, draped, and anesthetized with 1% lidocaine. Using the modified Seldinger technique a 5/6 French Slender sheath was placed in the right radial artery. Intra-arterial verapamil was administered through the radial artery sheath. IV heparin was administered after a JR4 catheter was advanced into the central aorta. A Swan-Ganz catheter was used for the right heart catheterization. Standard protocol was followed for recording of right heart pressures and sampling of oxygen saturations. Fick cardiac output was calculated. Standard Judkins catheters were used for selective coronary angiography. The aortic valve was not crossed. There were no immediate procedural complications. The patient was transferred to the post catheterization recovery area for further monitoring.     Estimated blood loss <50 mL.  During this procedure the patient was administered the following to achieve and maintain moderate conscious sedation: Versed 2 mg, Fentanyl 25 mcg, while the patient's heart rate, blood pressure, and oxygen saturation were continuously monitored. The period of conscious sedation was 27 minutes, of which I was present face-to-face 100% of this time.  Coronary Findings   Diagnostic    Dominance: Right  Left Main  Ost LM lesion 25% stenosed  Ost LM lesion is 25% stenosed. There is minor ostial stenosis estimated at 20 to 25%. No significant obstruction is present.  Left Anterior Descending  The vessel exhibits minimal luminal irregularities.  Ost LAD lesion 30% stenosed  Ost LAD lesion  is 30% stenosed. The ostium of the LAD has mild calcific stenosis estimated at 30 to 35%. The vessel is patent to the apex. The intermediate branch is patent.  Ramus Intermedius  Vessel is large. Widely patent vessel with no stenosis.  Left Circumflex  The vessel exhibits minimal luminal irregularities. The circumflex is normal in caliber. There is a single obtuse marginal branch. There is no significant stenosis.  Right Coronary Artery  The vessel exhibits minimal luminal irregularities. This is a large, dominant vessel. There is no significant stenosis throughout. There are minor irregularities present.  Intervention   No interventions have been documented.  Left Heart   Aortic Valve There is severe aortic valve stenosis. The aortic valve is calcified. There is abnormal aortic valve motion. There is known severe aortic stenosis. There is bulky calcification of the aortic valve with severely restricted leaflet mobility.  Coronary Diagrams   Diagnostic Diagram       Implants    No implant documentation for this case.  MERGE Images   Show images for CARDIAC CATHETERIZATION   Link to Procedure Log   Procedure Log    Hemo Data    Most Recent Value  Fick Cardiac Output 4.8 L/min  Fick Cardiac Output Index 2.77 (L/min)/BSA  RA A Wave 8 mmHg  RA V Wave 3 mmHg  RA Mean 2 mmHg  RV Systolic Pressure 31 mmHg  RV Diastolic Pressure 0 mmHg  RV EDP 5 mmHg  PA Systolic Pressure 29 mmHg  PA Diastolic Pressure 6 mmHg  PA Mean 13 mmHg  PW A Wave 17 mmHg  PW V Wave 15 mmHg  PW Mean 8 mmHg  AO Systolic Pressure 952 mmHg  AO Diastolic Pressure 61 mmHg  AO Mean 88 mmHg  QP/QS 1   TPVR Index 4.69 HRUI  TSVR Index 31.75 HRUI  PVR SVR Ratio 0.06  TPVR/TSVR Ratio 0.15     Cardiac TAVR CT  TECHNIQUE: The patient was scanned on a Siemens Force 841 slice scanner. A 120 kV retrospective scan was triggered in the ascending thoracic aorta at 140 HU's. Gantry rotation speed was 250 msecs and collimation was .6 mm. No beta blockade or nitro were given. The 3D data set was reconstructed in 5% intervals of the R-R cycle. Systolic and diastolic phases were analyzed on a dedicated work station using MPR, MIP and VRT modes. The patient received 80 cc of contrast.  FINDINGS: Aortic Valve: Severely calcified tri leaflet with restricted leaflet motion  Aorta: No aneurysm normal arch vessels mild calcific aortic debris  Sino-tubular Junction: 26 mm  Ascending Thoracic Aorta: 31 mm  Aortic Arch: 28 mm  Descending Thoracic Aorta: 23 mm  Sinus of Valsalva Measurements:  Non-coronary: 33 mm  Right - coronary: 33 mm  Left -   coronary: 34 mm  Coronary Artery Height above Annulus:  Left Main: Shallow only 8.5 mm above annulus  Right Coronary: 15 mm above annulus  Virtual Basal Annulus Measurements:  Maximum / Minimum Diameter: 21.4 mm x 28.5 mm  Perimeter: 81 mm  Area: 480 mm2  Coronary Arteries: LM shallow only 8.5 mm above annulus LM ostium itself is very large at 9 mm RCA sufficient height 15 mm  Optimum Fluoroscopic Angle for Delivery: LAO 12 Caudal 12 degrees  IMPRESSION: 1. Tri leaflet AV with annular area of 480 mm 2 suitable for a 26 mm Sapien 3 valve  2. Shallow LM coronary artery only 8.5 mm above annulus LM ostium itself is very  large at 9 mm which would decrease risk of obstruction RCA sufficient height above annulus for deployment 15 mm  3.  Normal aortic root 3.1 cm  4.  No LAA thrombus  5. Optimum angiographic angle for deployment LAO 12 degrees Caudal 12 degrees  Jenkins Rouge  Electronically  Signed: By: Jenkins Rouge M.D. On: 12/30/2017 13:36   CT ANGIOGRAPHY CHEST, ABDOMEN AND PELVIS  TECHNIQUE: Multidetector CT imaging through the chest, abdomen and pelvis was performed using the standard protocol during bolus administration of intravenous contrast. Multiplanar reconstructed images and MIPs were obtained and reviewed to evaluate the vascular anatomy.  CONTRAST:  4mL ISOVUE-370 IOPAMIDOL (ISOVUE-370) INJECTION 76%  COMPARISON:  None.  FINDINGS: CTA CHEST FINDINGS  Cardiovascular: Heart size is enlarged. There is no significant pericardial fluid, thickening or pericardial calcification. There is aortic atherosclerosis, as well as atherosclerosis of the great vessels of the mediastinum and the coronary arteries, including calcified atherosclerotic plaque in the left main and right coronary arteries. Severe thickening calcification of the aortic valve.  Mediastinum/Lymph Nodes: No pathologically enlarged mediastinal or hilar lymph nodes. Esophagus is unremarkable in appearance. No axillary lymphadenopathy.  Lungs/Pleura: A few scattered 2-3 mm pulmonary nodules are noted throughout the lungs bilaterally, nonspecific but statistically likely benign. No larger more suspicious appearing pulmonary nodules or masses. No acute consolidative airspace disease. No pleural effusions.  Musculoskeletal/Soft Tissues: There are no aggressive appearing lytic or blastic lesions noted in the visualized portions of the skeleton.  CTA ABDOMEN AND PELVIS FINDINGS  Hepatobiliary: No suspicious cystic or solid hepatic lesions. No intra or extrahepatic biliary ductal dilatation. Gallbladder is normal in appearance.  Pancreas: No pancreatic mass. No pancreatic ductal dilatation. No pancreatic or peripancreatic fluid or inflammatory changes.  Spleen: Unremarkable.  Adrenals/Urinary Tract: Bilateral kidneys and adrenal glands are normal in appearance. No  hydroureteronephrosis. Urinary bladder is normal in appearance.  Stomach/Bowel: Normal appearance of the stomach. No pathologic dilatation of small bowel or colon. The appendix is not confidently identified and may be surgically absent. Regardless, there are no inflammatory changes noted adjacent to the cecum to suggest the presence of an acute appendicitis at this time.  Vascular/Lymphatic: Aortic atherosclerosis, without evidence of aneurysm or dissection in the abdominal or pelvic vasculature. Vascular findings and measurements pertinent to potential TAVR procedure, as detailed below. 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 Superior mesenteric artery and inferior mesenteric artery are both widely patent without hemodynamically significant stenosis. Two right-sided renal arteries and a single left renal artery are all widely patent without hemodynamically significant stenosis. No lymphadenopathy noted in the abdomen or pelvis.  Reproductive: Prostate gland and seminal vesicles are unremarkable in appearance.  Other: No significant volume of ascites.  No pneumoperitoneum.  Musculoskeletal: There are no aggressive appearing lytic or blastic lesions noted in the visualized portions of the skeleton.  VASCULAR MEASUREMENTS PERTINENT TO TAVR:  AORTA:  Minimal Aortic Diameter-14 x 16 mm  Severity of Aortic Calcification-moderate  RIGHT PELVIS:  Right Common Iliac Artery -  Minimal Diameter-9.4 x 8.9 mm  Tortuosity-moderate  Calcification - moderate  Right External Iliac Artery -  Minimal Diameter-8.1 x 8.4 mm  Tortuosity-none  Calcification - moderate  Right Common Femoral Artery -  Minimal Diameter-8.9 x 8.8 mm  Tortuosity-none  Calcification-mild  LEFT PELVIS:  Left Common Iliac Artery -  Minimal Diameter-9.8 x 8.2 mm  Tortuosity-moderate  Calcification - moderate  Left External Iliac Artery -  Minimal  Diameter-8.2 x 7.4 mm  Tortuosity-none  Calcification - moderate  Left Common Femoral Artery -  Minimal Diameter-7.9 x 8.7 mm  Tortuosity-none  Calcification-mild  Review of the MIP images confirms the above findings.  IMPRESSION: 1. Vascular findings and measurements pertinent to potential TAVR procedure, as detailed above. 2. Severe thickening calcification of the aortic valve, compatible with the reported clinical history of severe aortic stenosis. 3. Aortic atherosclerosis, in addition to left main and right coronary artery disease. Assessment for potential risk factor modification, dietary therapy or pharmacologic therapy may be warranted, if clinically indicated. 4. Cardiomegaly. 5. Additional incidental findings, as above.   Electronically Signed   By: Vinnie Langton M.D.   On: 12/31/2017 13:53   STS Risk Calculator  Procedure: Isolated AVR CALCULATE   Risk of Mortality:  1.237% Renal Failure:  0.799% Permanent Stroke:  1.041% Prolonged Ventilation:  4.502% DSW Infection:  0.052% Reoperation:  4.712% Morbidity or Mortality:  8.640% Short Length of Stay:  49.412% Long Length of Stay:  3.313%   Impression:  Patient has stage D severe symptomatic aortic stenosis.  He describes a gradual onset of mild symptoms of exertional shortness of breath and chest pressure that occur only with more strenuous exertion, consistent with chronic diastolic congestive heart failure New York Heart Association functional class I-II.  I have personally reviewed the patient's recent transthoracic echocardiogram, diagnostic cardiac catheterization, and CT angiograms.  Echocardiogram demonstrates the presence of severe aortic stenosis.  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  There is severe calcification, fibrosis, and thickening of the leaflets with severely restricted leaflet motion.  Peak  velocity across the aortic valve measured 4.4 m/s corresponding to mean transvalvular gradient estimated 46 mmHg.  Left ventricular systolic function remains normal.  Diagnostic cardiac catheterization was notable for the absence of significant coronary artery disease and normal right heart pressures.  I agree the patient would benefit from aortic valve replacement.  Risks associated with conventional surgery would be reasonably low and the patient would be considered a good candidate for minimally invasive approach for surgery if desired.  Cardiac-gated CTA of the heart reveals anatomical characteristics consistent with aortic stenosis suitable for treatment by transcatheter aortic valve replacement without any significant complicating features other than one area of calcification in the aortic annulus which might slightly increase the risk of paravalvular leak, although this calcification does not extend far below the annulus into the LV outflow tract.  CTA of the aorta and iliac vessels demonstrate what appears to be adequate pelvic vascular access to facilitate a transfemoral approach.  Baseline EKG reveals sinus bradycardia with slight intraventricular conduction delay consistent with partial right bundle branch block.  Under the circumstances risk of need for permanent pacemaker might be slightly increased but still anticipated likely less than 10%.    Plan:  The patient and his wife were counseled at length regarding treatment alternatives for management of severe symptomatic aortic stenosis. Alternative approaches such as conventional aortic valve replacement, transcatheter aortic valve replacement, and continued medical therapy without intervention were compared and contrasted at length.  The risks associated with conventional surgical aortic valve replacement were discussed in detail, as were expectations for post-operative convalescence.  Issues specific to transcatheter aortic valve replacement  were discussed including questions about long term valve durability, the potential for paravalvular leak, possible increased risk of need for permanent pacemaker placement, and other technical complications related to the procedure itself.  Long-term prognosis with medical therapy was discussed. This  discussion was placed in the context of the patient's own specific clinical presentation and past medical history.  All of their questions have been addressed.  The patient desires to proceed with transcatheter aortic valve replacement in the near future.  We tentatively plan to proceed with surgery on February 01, 2018  Following the decision to proceed with transcatheter aortic valve replacement, a discussion has been held regarding what types of management strategies would be attempted intraoperatively in the event of life-threatening complications, including whether or not the patient would be considered a candidate for the use of cardiopulmonary bypass and/or conversion to open sternotomy for attempted surgical intervention.  The patient has been advised of a variety of complications that might develop including but not limited to risks of death, stroke, paravalvular leak, aortic dissection or other major vascular complications, aortic annulus rupture, device embolization, cardiac rupture or perforation, mitral regurgitation, acute myocardial infarction, arrhythmia, heart block or bradycardia requiring permanent pacemaker placement, congestive heart failure, respiratory failure, renal failure, pneumonia, infection, other late complications related to structural valve deterioration or migration, or other complications that might ultimately cause a temporary or permanent loss of functional independence or other long term morbidity.  The patient provides full informed consent for the procedure as described and all questions were answered.     I spent in excess of 90 minutes during the conduct of this office  consultation and >50% of this time involved direct face-to-face encounter with the patient for counseling and/or coordination of their care.    Valentina Gu. Roxy Manns, MD 01/11/2018 4:13 PM

## 2018-01-11 NOTE — Patient Instructions (Signed)
Continue all previous medications without any changes at this time  

## 2018-01-11 NOTE — H&P (View-Only) (Signed)
HE[facility] [place] SURGERY CONSULTATION REPORT  Referring Provider is Sherren Mocha, MD PCP is Marton Redwood, MD  Chief Complaint  Patient presents with  . Aortic Stenosis    surgical eval for TAVR    HPI:  Patient is a 77 year old male who has been referred for surgical consultation to discuss treatment options for management of severe symptomatic aortic stenosis.  Patient states that he has known of presence of a heart murmur for many years.  He has been remarkably healthy and physically active all of his life, and he has been followed carefully by his primary care physician.  Patient has remained quite active physically, although he admits that over the past year or so he has noticed a mild decrease in his exercise tolerance with mild pressure-like discomfort and exertional shortness of breath with more strenuous exertion.  The patient walks 4 miles every day including walking up and [place] hills in the mountains.  He states that he has developed a tendency to slow down and he has found that he has to "push himself through" to finish his height sometimes.  He otherwise feels fine and he denies any symptoms of shortness of breath or chest discomfort with low-level activity or at rest.  He is never had any palpitations, dizzy spells, nor syncope.  He denies any orthopnea or lower extremity edema.    Recent follow-up echocardiogram revealed the presence of severe aortic stenosis with preserved left ventricular systolic function.  The patient was subsequently referred to the multidisciplinary heart valve clinic and has been evaluated previously by Dr. Burt Knack.  He underwent diagnostic cardiac catheterization on December 28, 2017 and was found to have mild nonobstructive coronary artery disease with normal right heart pressures.  CT angiography was performed and cardiothoracic surgical consultation requested.  Patient is married and lives  in [place] with his wife.  They spend a fair amount of time in the mountains and enjoys hiking on a daily basis.  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  He remains quite active physically and reports no physical limitations whatsoever other than that as described previously.  Past Medical History:  Diagnosis Date  . Cataract, bilateral   . Severe aortic stenosis     Past Surgical History:  Procedure Laterality Date  . CATARACT EXTRACTION Bilateral 2015  . COLONOSCOPY  2005   [place] (unknown MD)  . CORONARY ANGIOGRAPHY N/A 12/28/2017   Procedure: CORONARY ANGIOGRAPHY (CATH LAB);  Surgeon: Sherren Mocha, MD;  Location: [facility];  Service: Cardiovascular;  Laterality: N/A;  . ELECTROCARDIOGRAM  2015   murmur  . HERNIA REPAIR     childhood  . TONSILLECTOMY     Childhood    Family History  Problem Relation Age of Onset  . Colon cancer Neg Hx   . Rectal cancer Neg Hx   . Stomach cancer Neg Hx     Social History   Socioeconomic History  . Marital status: Married    Spouse name: Not on file  . Number of children: Not on file  . Years of education: Not on file  . Highest education level: Not on file  Occupational History  . Not on file  Social Needs  . Financial resource strain: Not on file  . Food insecurity:    Worry: Not on file    Inability: Not on  file  . Transportation needs:    Medical: Not on file    Non-medical: Not on file  Tobacco Use  . Smoking status: Never Smoker  . Smokeless tobacco: Never Used  Substance and Sexual Activity  . Alcohol use: Yes    Alcohol/week: 3.0 standard drinks    Types: 3 Standard drinks or equivalent per week  . Drug use: Not on file  . Sexual activity: Not on file  Lifestyle  . Physical activity:    Days per week: Not on file    Minutes per session: Not on file  . Stress: Not on file  Relationships  . Social  connections:    Talks on phone: Not on file    Gets together: Not on file    Attends religious service: Not on file    Active member of club or organization: Not on file    Attends meetings of clubs or organizations: Not on file    Relationship status: Not on file  . Intimate partner violence:    Fear of current or ex partner: Not on file    Emotionally abused: Not on file    Physically abused: Not on file    Forced sexual activity: Not on file  Other Topics Concern  . Not on file  Social History Narrative  . Not on file    Current Outpatient Medications  Medication Sig Dispense Refill  . Coenzyme Q10 (COQ10) 100 MG CAPS Take 100 mg by mouth daily.    . hydrocortisone 2.5 % ointment Apply 1 application topically 2 (two) times daily.   0  . metroNIDAZOLE (METROGEL) 0.75 % gel Apply 1 application topically 2 (two) times daily.   2  . Multiple Vitamins-Minerals (MULTIVITAMIN WITH MINERALS) tablet Take 1 tablet by mouth daily.    . Omega-3 Fatty Acids (OMEGA 3 PO) Take 1 capsule by mouth daily.      No current facility-administered medications for this visit.     Allergies  Allergen Reactions  . Ether Nausea Only    Ether used as anesthesia when he was a child      Review of Systems:   General:  normal appetite, normal energy, no weight gain, no weight loss, no fever  Cardiac:  + mild chest pain with exertion, no chest pain at rest, + SOB with strenuous exertion, no resting SOB, no PND, no orthopnea, no palpitations, no arrhythmia, no atrial fibrillation, no LE edema, no dizzy spells, no syncope  Respiratory:  no shortness of breath, no home oxygen, no productive cough, no dry cough, no bronchitis, no wheezing, no hemoptysis, no asthma, no pain with inspiration or cough, no sleep apnea, no CPAP at night  GI:   no difficulty swallowing, no reflux, no frequent heartburn, no hiatal hernia, no abdominal pain, no constipation, no diarrhea, no hematochezia, no hematemesis, no  melena  GU:   no dysuria,  no frequency, no urinary tract infection, no hematuria, no enlarged prostate, no kidney stones, no kidney disease  Vascular:  no pain suggestive of claudication, no pain in feet, no leg cramps, no varicose veins, no DVT, no non-healing foot ulcer  Neuro:   no stroke, no TIA's, no seizures, no headaches, no temporary blindness one eye,  no slurred speech, no peripheral neuropathy, no chronic pain, no instability of gait, no memory/cognitive dysfunction  Musculoskeletal: no arthritis, no joint swelling, no myalgias, no difficulty walking, normal mobility   Skin:   + rash, + itching, + skin infections, no  pressure sores or ulcerations  Psych:   no anxiety, no depression, no nervousness, no unusual recent stress  Eyes:   no blurry vision, no floaters, no recent vision changes, no wears glasses or contacts  ENT:   no hearing loss, no loose or painful teeth, no dentures, last saw dentist within the past year  Hematologic:  no easy bruising, no abnormal bleeding, no clotting disorder, no frequent epistaxis  Endocrine:  no diabetes, does not check CBG's at home           Physical Exam:   BP 140/70 (BP Location: Right Arm, Patient Position: Sitting, Cuff Size: Normal) Comment (Cuff Size): MANUALLY  Pulse (!) 48   Resp 16   Ht 5\' 7"  (1.702 m)   Wt 137 lb (62.1 kg)   SpO2 96%   BMI 21.46 kg/m   General:  Thin,  well-appearing  HEENT:  Unremarkable   Neck:   no JVD, no bruits, no adenopathy   Chest:   clear to auscultation, symmetrical breath sounds, no wheezes, no rhonchi   CV:   RRR, grade III/VI crescendo/decrescendo murmur heard best at RSB,  no diastolic murmur  Abdomen:  soft, non-tender, no masses   Extremities:  warm, well-perfused, pulses palpable, no LE edema  Rectal/GU  Deferred  Neuro:   Grossly non-focal and symmetrical throughout  Skin:   Clean and dry, no rashes, no breakdown   Diagnostic Tests:  Transthoracic Echocardiography  Patient:     Dora, Clauss MR #:       [id number] Study Date: 12/13/2017 Gender:     M Age:        77 Height:     170.2 cm Weight:     69.4 kg BSA:        1.82 m^2 Pt. Status: Room:   Lieutenant [facility]  ATTENDING    Loralie Champagne, M.D.  SONOGRAPHER  Marygrace Drought, RCS  PERFORMING   [facility], Outpatient  cc:  ------------------------------------------------------------------- LV EF: 55% -   60%  ------------------------------------------------------------------- Indications:      Aortic Valve Disorder (I35.0).  ------------------------------------------------------------------- History:   PMH:   Murmur.  ------------------------------------------------------------------- Study Conclusions  - Left ventricle: The cavity size was normal. Wall thickness was   increased in a pattern of moderate LVH. Systolic function was   normal. The estimated ejection fraction was in the range of 55%   to 60%. Wall motion was normal; there were no regional wall   motion abnormalities. Features are consistent with a pseudonormal   left ventricular filling pattern, with concomitant abnormal   relaxation and increased filling pressure (grade 2 diastolic   dysfunction). - Aortic valve: Trileaflet; severely calcified leaflets. There was   severe stenosis. There was trivial regurgitation. Mean gradient   (S): 46 mm Hg. Peak gradient (S): 79 mm Hg. Valve area (VTI):   0.75 cm^2. - Mitral valve: Mildly calcified annulus. There was mild to   moderate regurgitation. - Left atrium: The atrium was severely dilated. - Right ventricle: The cavity size was normal. Systolic function   was normal. - Right atrium: The atrium was mildly dilated. - Tricuspid valve: Peak RV-RA gradient (S): 33 mm Hg. - Pulmonic valve: There was mild to moderate regurgitation. - Pulmonary arteries: PA peak pressure: 41 mm Hg (S). - Systemic veins: IVC measured 2.1 cm with > 50% respirophasic    variation, suggesting RA pressure 8 mmHg.  Impressions:  -  Normal LV size with moderate LV hypertrophy. EF 55-60%. Moderate   diastolic dysfunction. Normal RV size and systolic function.   Severe aortic stenosis. Mild to moderate mitral regurgitation.   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  ------------------------------------------------------------------- Study data:  Comparison was made to the study of 12/05/2015.  Study status:  Routine.  Procedure:  The patient reported no pain pre or post test. Transthoracic echocardiography. Image quality was adequate.          Transthoracic echocardiography.  M-mode, complete 2D, spectral Doppler, and color Doppler.  Birthdate: Patient birthdate: 1940/08/10.  Age:  Patient is 77 yr old.  Sex: Gender: male.    BMI: 24 kg/m^2.  Blood pressure:     168/84 Patient status:  Outpatient.  Study date:  Study date: 12/13/2017. Study time: 07:50 AM.  Location:  [facility]  -------------------------------------------------------------------  ------------------------------------------------------------------- Left ventricle:  The cavity size was normal. Wall thickness was increased in a pattern of moderate LVH. Systolic function was normal. The estimated ejection fraction was in the range of 55% to 60%. Wall motion was normal; there were no regional wall motion abnormalities. Features are consistent with a pseudonormal left ventricular filling pattern, with concomitant abnormal relaxation and increased filling pressure (grade 2 diastolic dysfunction).  ------------------------------------------------------------------- Aortic valve:   Trileaflet; severely calcified leaflets.  Doppler:  There was severe stenosis.   There was trivial regurgitation. VTI ratio of LVOT to aortic valve: 0.24. Valve area (VTI): 0.75 cm^2. Indexed valve area (VTI): 0.41 cm^2/m^2. Peak velocity ratio of LVOT to aortic valve:  0.2. Valve area (Vmax): 0.64 cm^2. Indexed valve area (Vmax): 0.35 cm^2/m^2. Mean velocity ratio of LVOT to aortic valve: 0.21. Valve area (Vmean): 0.66 cm^2. Indexed valve area (Vmean): 0.36 cm^2/m^2.    Mean gradient (S): 46 mm Hg. Peak gradient (S): 79 mm Hg.  ------------------------------------------------------------------- Aorta:  Aortic root: The aortic root was normal in size. Ascending aorta: The ascending aorta was normal in size.  ------------------------------------------------------------------- Mitral valve:   Mildly calcified annulus.  Doppler:   There was no evidence for stenosis.   There was mild to moderate regurgitation.   Peak gradient (D): 2 mm Hg.  ------------------------------------------------------------------- Left atrium:  The atrium was severely dilated.  ------------------------------------------------------------------- Right ventricle:  The cavity size was normal. Systolic function was normal.  ------------------------------------------------------------------- Pulmonic valve:    Structurally normal valve.   Cusp separation was normal.  Doppler:  Transvalvular velocity was within the normal range. There was mild to moderate regurgitation.  ------------------------------------------------------------------- Tricuspid valve:   Doppler:  There was trivial regurgitation.   ------------------------------------------------------------------- Right atrium:  The atrium was mildly dilated.  ------------------------------------------------------------------- Pericardium:  There was no pericardial effusion.  ------------------------------------------------------------------- Systemic veins:  IVC measured 2.1 cm with > 50% respirophasic variation, suggesting RA pressure 8 mmHg. Inferior vena cava: Diameter: 21 mm.  ------------------------------------------------------------------- Measurements   IVC                                      Value           Reference  ID                                       21    mm       ----------  Left ventricle                           Value          Reference  LV ID, ED, PLAX chordal          (L)     40.5  mm       43 - 52  LV ID, ES, PLAX chordal                  28    mm       23 - 38  LV fx shortening, PLAX chordal           31    %        >=29  LV PW thickness, ED                      17.4  mm       ----------  IVS/LV PW ratio, ED                      0.98           <=1.3  Stroke volume, 2D                        89    ml       ----------  Stroke volume/bsa, 2D                    49    ml/m^2   ----------  LV e&', lateral                           8.49  cm/s     ----------  LV E/e&', lateral                         8.92           ----------  LV e&', medial                            5.11  cm/s     ----------  LV E/e&', medial                          14.81          ----------  LV e&', average                           6.8   cm/s     ----------  LV E/e&', average                         11.13          ----------    Ventricular septum                       Value          Reference  IVS thickness, ED                        17    mm       ----------    LVOT  Value          Reference  LVOT ID, S                               20    mm       ----------  LVOT area                                3.14  cm^2     ----------  LVOT peak velocity, S                    90.9  cm/s     ----------  LVOT mean velocity, S                    67.6  cm/s     ----------  LVOT VTI, S                              28.3  cm       ----------    Aortic valve                             Value          Reference  Aortic valve peak velocity, S            444   cm/s     ----------  Aortic valve mean velocity, S            323   cm/s     ----------  Aortic valve VTI, S                      118   cm       ----------  Aortic mean gradient, S                  46    mm Hg    ----------   Aortic peak gradient, S                  79    mm Hg    ----------  VTI ratio, LVOT/AV                       0.24           ----------  Aortic valve area, VTI                   0.75  cm^2     ----------  Aortic valve area/bsa, VTI               0.41  cm^2/m^2 ----------  Velocity ratio, peak, LVOT/AV            0.2            ----------  Aortic valve area, peak velocity         0.64  cm^2     ----------  Aortic valve area/bsa, peak              0.35  cm^2/m^2 ----------  velocity  Velocity ratio, mean, LVOT/AV            0.21           ----------  Aortic valve area, mean velocity         0.66  cm^2     ----------  Aortic valve area/bsa, mean              0.36  cm^2/m^2 ----------  velocity  Aortic regurg pressure half-time         653   ms       ----------    Aorta                                    Value          Reference  Aortic root ID, ED                       36    mm       ----------    Left atrium                              Value          Reference  LA ID, A-P, ES                           47    mm       ----------  LA ID/bsa, A-P                   (H)     2.59  cm/m^2   <=2.2  LA volume, S                             128   ml       ----------  LA volume/bsa, S                         70.4  ml/m^2   ----------  LA volume, ES, 1-p A4C                   141   ml       ----------  LA volume/bsa, ES, 1-p A4C               77.6  ml/m^2   ----------  LA volume, ES, 1-p A2C                   113   ml       ----------  LA volume/bsa, ES, 1-p A2C               62.2  ml/m^2   ----------    Mitral valve                             Value          Reference  Mitral E-wave peak velocity              75.7  cm/s     ----------  Mitral A-wave peak velocity              51.4  cm/s     ----------  Mitral deceleration time         (H)     352   ms  150 - 230  Mitral peak gradient, D                  2     mm Hg    ----------  Mitral E/A ratio, peak                   1.5             ----------  Mitral regurg VTI, PISA                  258   cm       ----------  Mitral ERO, PISA                         0.05  cm^2     ----------  Mitral regurg volume, PISA               13    ml       ----------    Pulmonary arteries                       Value          Reference  PA pressure, S, DP               (H)     41    mm Hg    <=30    Tricuspid valve                          Value          Reference  Tricuspid regurg peak velocity           288   cm/s     ----------  Tricuspid peak RV-RA gradient            33    mm Hg    ----------  Tricuspid maximal regurg                 288   cm/s     ----------  velocity, PISA    Right atrium                             Value          Reference  RA ID, S-I, ES, A4C              (H)     58.6  mm       34 - 49  RA area, ES, A4C                         17.8  cm^2     8.3 - 19.5  RA volume, ES, A/L                       45.4  ml       ----------  RA volume/bsa, ES, A/L                   25    ml/m^2   ----------    Systemic veins                           Value          Reference  Estimated CVP  3     mm Hg    ----------    Right ventricle                          Value          Reference  TAPSE                                    28.2  mm       ----------  RV pressure, S, DP               (H)     36    mm Hg    <=30  RV s&', lateral, S                        15    cm/s     ----------  Legend: (L)  and  (H)  mark values outside specified reference range.  ------------------------------------------------------------------- Prepared and Electronically Authenticated by  Loralie Champagne, M.D. 2019-08-12T13:42:46  CORONARY ANGIOGRAPHY (CATH LAB)  Conclusion     Ost LM lesion is 25% stenosed.  Ost LAD lesion is 30% stenosed.  There is severe aortic valve stenosis.   1.  Patent coronary arteries with mild nonobstructive stenosis at the ostium of the LAD, otherwise minimal luminal irregularities 2.   Known severe aortic stenosis by noninvasive assessment with bulky calcification of the aortic valve seen on plain fluoroscopy and severely limited leaflet mobility. 3.  Normal right heart pressures and cardiac output by invasive assessment.  Recommendations: Continued multidisciplinary heart valve team assessment for treatment of severe aortic stenosis.     Indications   Severe aortic stenosis [I35.0 (ICD-10-CM)]  Procedural Details/Technique   Technical Details INDICATION: Severe symptomatic aortic stenosis  PROCEDURAL DETAILS: There was an indwelling IV in a right antecubital vein. Using normal sterile technique, the IV was changed out for a 5 Fr brachial sheath over a 0.018 inch wire. The right wrist was then prepped, draped, and anesthetized with 1% lidocaine. Using the modified Seldinger technique a 5/6 French Slender sheath was placed in the right radial artery. Intra-arterial verapamil was administered through the radial artery sheath. IV heparin was administered after a JR4 catheter was advanced into the central aorta. A Swan-Ganz catheter was used for the right heart catheterization. Standard protocol was followed for recording of right heart pressures and sampling of oxygen saturations. Fick cardiac output was calculated. Standard Judkins catheters were used for selective coronary angiography. The aortic valve was not crossed. There were no immediate procedural complications. The patient was transferred to the post catheterization recovery area for further monitoring.     Estimated blood loss <50 mL.  During this procedure the patient was administered the following to achieve and maintain moderate conscious sedation: Versed 2 mg, Fentanyl 25 mcg, while the patient's heart rate, blood pressure, and oxygen saturation were continuously monitored. The period of conscious sedation was 27 minutes, of which I was present face-to-face 100% of this time.  Coronary Findings   Diagnostic    Dominance: Right  Left Main  Ost LM lesion 25% stenosed  Ost LM lesion is 25% stenosed. There is minor ostial stenosis estimated at 20 to 25%. No significant obstruction is present.  Left Anterior Descending  The vessel exhibits minimal luminal irregularities.  Ost LAD lesion 30% stenosed  Ost LAD lesion  is 30% stenosed. The ostium of the LAD has mild calcific stenosis estimated at 30 to 35%. The vessel is patent to the apex. The intermediate branch is patent.  Ramus Intermedius  Vessel is large. Widely patent vessel with no stenosis.  Left Circumflex  The vessel exhibits minimal luminal irregularities. The circumflex is normal in caliber. There is a single obtuse marginal branch. There is no significant stenosis.  Right Coronary Artery  The vessel exhibits minimal luminal irregularities. This is a large, dominant vessel. There is no significant stenosis throughout. There are minor irregularities present.  Intervention   No interventions have been documented.  Left Heart   Aortic Valve There is severe aortic valve stenosis. The aortic valve is calcified. There is abnormal aortic valve motion. There is known severe aortic stenosis. There is bulky calcification of the aortic valve with severely restricted leaflet mobility.  Coronary Diagrams   Diagnostic Diagram       Implants    No implant documentation for this case.  MERGE Images   Show images for CARDIAC CATHETERIZATION   Link to Procedure Log   Procedure Log    Hemo Data    Most Recent Value  Fick Cardiac Output 4.8 L/min  Fick Cardiac Output Index 2.77 (L/min)/BSA  RA A Wave 8 mmHg  RA V Wave 3 mmHg  RA Mean 2 mmHg  RV Systolic Pressure 31 mmHg  RV Diastolic Pressure 0 mmHg  RV EDP 5 mmHg  PA Systolic Pressure 29 mmHg  PA Diastolic Pressure 6 mmHg  PA Mean 13 mmHg  PW A Wave 17 mmHg  PW V Wave 15 mmHg  PW Mean 8 mmHg  AO Systolic Pressure 419 mmHg  AO Diastolic Pressure 61 mmHg  AO Mean 88 mmHg  QP/QS 1   TPVR Index 4.69 HRUI  TSVR Index 31.75 HRUI  PVR SVR Ratio 0.06  TPVR/TSVR Ratio 0.15     Cardiac TAVR CT  TECHNIQUE: The patient was scanned on a Siemens Force 622 slice scanner. A 120 kV retrospective scan was triggered in the ascending thoracic aorta at 140 HU's. Gantry rotation speed was 250 msecs and collimation was .6 mm. No beta blockade or nitro were given. The 3D data set was reconstructed in 5% intervals of the R-R cycle. Systolic and diastolic phases were analyzed on a dedicated work station using MPR, MIP and VRT modes. The patient received 80 cc of contrast.  FINDINGS: Aortic Valve: Severely calcified tri leaflet with restricted leaflet motion  Aorta: No aneurysm normal arch vessels mild calcific aortic debris  Sino-tubular Junction: 26 mm  Ascending Thoracic Aorta: 31 mm  Aortic Arch: 28 mm  Descending Thoracic Aorta: 23 mm  Sinus of Valsalva Measurements:  Non-coronary: 33 mm  Right - coronary: 33 mm  Left -   coronary: 34 mm  Coronary Artery Height above Annulus:  Left Main: Shallow only 8.5 mm above annulus  Right Coronary: 15 mm above annulus  Virtual Basal Annulus Measurements:  Maximum / Minimum Diameter: 21.4 mm x 28.5 mm  Perimeter: 81 mm  Area: 480 mm2  Coronary Arteries: LM shallow only 8.5 mm above annulus LM ostium itself is very large at 9 mm RCA sufficient height 15 mm  Optimum Fluoroscopic Angle for Delivery: LAO 12 Caudal 12 degrees  IMPRESSION: 1. Tri leaflet AV with annular area of 480 mm 2 suitable for a 26 mm Sapien 3 valve  2. Shallow LM coronary artery only 8.5 mm above annulus LM ostium itself is very  large at 9 mm which would decrease risk of obstruction RCA sufficient height above annulus for deployment 15 mm  3.  Normal aortic root 3.1 cm  4.  No LAA thrombus  5. Optimum angiographic angle for deployment LAO 12 degrees Caudal 12 degrees  Jenkins Rouge  Electronically  Signed: By: Jenkins Rouge M.D. On: 12/30/2017 13:36   CT ANGIOGRAPHY CHEST, ABDOMEN AND PELVIS  TECHNIQUE: Multidetector CT imaging through the chest, abdomen and pelvis was performed using the standard protocol during bolus administration of intravenous contrast. Multiplanar reconstructed images and MIPs were obtained and reviewed to evaluate the vascular anatomy.  CONTRAST:  67mL ISOVUE-370 IOPAMIDOL (ISOVUE-370) INJECTION 76%  COMPARISON:  None.  FINDINGS: CTA CHEST FINDINGS  Cardiovascular: Heart size is enlarged. There is no significant pericardial fluid, thickening or pericardial calcification. There is aortic atherosclerosis, as well as atherosclerosis of the great vessels of the mediastinum and the coronary arteries, including calcified atherosclerotic plaque in the left main and right coronary arteries. Severe thickening calcification of the aortic valve.  Mediastinum/Lymph Nodes: No pathologically enlarged mediastinal or hilar lymph nodes. Esophagus is unremarkable in appearance. No axillary lymphadenopathy.  Lungs/Pleura: A few scattered 2-3 mm pulmonary nodules are noted throughout the lungs bilaterally, nonspecific but statistically likely benign. No larger more suspicious appearing pulmonary nodules or masses. No acute consolidative airspace disease. No pleural effusions.  Musculoskeletal/Soft Tissues: There are no aggressive appearing lytic or blastic lesions noted in the visualized portions of the skeleton.  CTA ABDOMEN AND PELVIS FINDINGS  Hepatobiliary: No suspicious cystic or solid hepatic lesions. No intra or extrahepatic biliary ductal dilatation. Gallbladder is normal in appearance.  Pancreas: No pancreatic mass. No pancreatic ductal dilatation. No pancreatic or peripancreatic fluid or inflammatory changes.  Spleen: Unremarkable.  Adrenals/Urinary Tract: Bilateral kidneys and adrenal glands are normal in appearance. No  hydroureteronephrosis. Urinary bladder is normal in appearance.  Stomach/Bowel: Normal appearance of the stomach. No pathologic dilatation of small bowel or colon. The appendix is not confidently identified and may be surgically absent. Regardless, there are no inflammatory changes noted adjacent to the cecum to suggest the presence of an acute appendicitis at this time.  Vascular/Lymphatic: Aortic atherosclerosis, without evidence of aneurysm or dissection in the abdominal or pelvic vasculature. Vascular findings and measurements pertinent to potential TAVR procedure, as detailed below. 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 Superior mesenteric artery and inferior mesenteric artery are both widely patent without hemodynamically significant stenosis. Two right-sided renal arteries and a single left renal artery are all widely patent without hemodynamically significant stenosis. No lymphadenopathy noted in the abdomen or pelvis.  Reproductive: Prostate gland and seminal vesicles are unremarkable in appearance.  Other: No significant volume of ascites.  No pneumoperitoneum.  Musculoskeletal: There are no aggressive appearing lytic or blastic lesions noted in the visualized portions of the skeleton.  VASCULAR MEASUREMENTS PERTINENT TO TAVR:  AORTA:  Minimal Aortic Diameter-14 x 16 mm  Severity of Aortic Calcification-moderate  RIGHT PELVIS:  Right Common Iliac Artery -  Minimal Diameter-9.4 x 8.9 mm  Tortuosity-moderate  Calcification - moderate  Right External Iliac Artery -  Minimal Diameter-8.1 x 8.4 mm  Tortuosity-none  Calcification - moderate  Right Common Femoral Artery -  Minimal Diameter-8.9 x 8.8 mm  Tortuosity-none  Calcification-mild  LEFT PELVIS:  Left Common Iliac Artery -  Minimal Diameter-9.8 x 8.2 mm  Tortuosity-moderate  Calcification - moderate  Left External Iliac Artery -  Minimal  Diameter-8.2 x 7.4 mm  Tortuosity-none  Calcification - moderate  Left Common Femoral Artery -  Minimal Diameter-7.9 x 8.7 mm  Tortuosity-none  Calcification-mild  Review of the MIP images confirms the above findings.  IMPRESSION: 1. Vascular findings and measurements pertinent to potential TAVR procedure, as detailed above. 2. Severe thickening calcification of the aortic valve, compatible with the reported clinical history of severe aortic stenosis. 3. Aortic atherosclerosis, in addition to left main and right coronary artery disease. Assessment for potential risk factor modification, dietary therapy or pharmacologic therapy may be warranted, if clinically indicated. 4. Cardiomegaly. 5. Additional incidental findings, as above.   Electronically Signed   By: Vinnie Langton M.D.   On: 12/31/2017 13:53   STS Risk Calculator  Procedure: Isolated AVR CALCULATE   Risk of Mortality:  1.237% Renal Failure:  0.799% Permanent Stroke:  1.041% Prolonged Ventilation:  4.502% DSW Infection:  0.052% Reoperation:  4.712% Morbidity or Mortality:  8.640% Short Length of Stay:  49.412% Long Length of Stay:  3.313%   Impression:  Patient has stage D severe symptomatic aortic stenosis.  He describes a gradual onset of mild symptoms of exertional shortness of breath and chest pressure that occur only with more strenuous exertion, consistent with chronic diastolic congestive heart failure New York Heart Association functional class I-II.  I have personally reviewed the patient's recent transthoracic echocardiogram, diagnostic cardiac catheterization, and CT angiograms.  Echocardiogram demonstrates the presence of severe aortic stenosis.  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  There is severe calcification, fibrosis, and thickening of the leaflets with severely restricted leaflet motion.  Peak  velocity across the aortic valve measured 4.4 m/s corresponding to mean transvalvular gradient estimated 46 mmHg.  Left ventricular systolic function remains normal.  Diagnostic cardiac catheterization was notable for the absence of significant coronary artery disease and normal right heart pressures.  I agree the patient would benefit from aortic valve replacement.  Risks associated with conventional surgery would be reasonably low and the patient would be considered a good candidate for minimally invasive approach for surgery if desired.  Cardiac-gated CTA of the heart reveals anatomical characteristics consistent with aortic stenosis suitable for treatment by transcatheter aortic valve replacement without any significant complicating features other than one area of calcification in the aortic annulus which might slightly increase the risk of paravalvular leak, although this calcification does not extend far below the annulus into the LV outflow tract.  CTA of the aorta and iliac vessels demonstrate what appears to be adequate pelvic vascular access to facilitate a transfemoral approach.  Baseline EKG reveals sinus bradycardia with slight intraventricular conduction delay consistent with partial right bundle branch block.  Under the circumstances risk of need for permanent pacemaker might be slightly increased but still anticipated likely less than 10%.    Plan:  The patient and his wife were counseled at length regarding treatment alternatives for management of severe symptomatic aortic stenosis. Alternative approaches such as conventional aortic valve replacement, transcatheter aortic valve replacement, and continued medical therapy without intervention were compared and contrasted at length.  The risks associated with conventional surgical aortic valve replacement were discussed in detail, as were expectations for post-operative convalescence.  Issues specific to transcatheter aortic valve replacement  were discussed including questions about long term valve durability, the potential for paravalvular leak, possible increased risk of need for permanent pacemaker placement, and other technical complications related to the procedure itself.  Long-term prognosis with medical therapy was discussed. This  discussion was placed in the context of the patient's own specific clinical presentation and past medical history.  All of their questions have been addressed.  The patient desires to proceed with transcatheter aortic valve replacement in the near future.  We tentatively plan to proceed with surgery on February 01, 2018  Following the decision to proceed with transcatheter aortic valve replacement, a discussion has been held regarding what types of management strategies would be attempted intraoperatively in the event of life-threatening complications, including whether or not the patient would be considered a candidate for the use of cardiopulmonary bypass and/or conversion to open sternotomy for attempted surgical intervention.  The patient has been advised of a variety of complications that might develop including but not limited to risks of death, stroke, paravalvular leak, aortic dissection or other major vascular complications, aortic annulus rupture, device embolization, cardiac rupture or perforation, mitral regurgitation, acute myocardial infarction, arrhythmia, heart block or bradycardia requiring permanent pacemaker placement, congestive heart failure, respiratory failure, renal failure, pneumonia, infection, other late complications related to structural valve deterioration or migration, or other complications that might ultimately cause a temporary or permanent loss of functional independence or other long term morbidity.  The patient provides full informed consent for the procedure as described and all questions were answered.     I spent in excess of 90 minutes during the conduct of this office  consultation and >50% of this time involved direct face-to-face encounter with the patient for counseling and/or coordination of their care.    Valentina Gu. Roxy Manns, MD 01/11/2018 4:13 PM

## 2018-01-11 NOTE — Therapy (Signed)
[place], [place], [address] Phone: [phone]   Fax:  [phone]  Physical Therapy Evaluation  Patient Details  Name: Jared Norton. MRN: [id number] Date of Birth: 12-18-1940 Referring Provider: Sherren Mocha, MD   Encounter Date: 01/11/2018  PT End of Session - 01/11/18 1505    Visit Number  1    Authorization Type  TAVR evaluation    PT Start Time  1420    PT Stop Time  1452    PT Time Calculation (min)  32 min    Activity Tolerance  Patient tolerated treatment well    Behavior During Therapy  [facility] for tasks assessed/performed       Past Medical History:  Diagnosis Date  . Cataract, bilateral   . Severe aortic stenosis     Past Surgical History:  Procedure Laterality Date  . CATARACT EXTRACTION Bilateral 2015  . COLONOSCOPY  2005   [place], [place] (unknown MD)  . CORONARY ANGIOGRAPHY N/A 12/28/2017   Procedure: CORONARY ANGIOGRAPHY (CATH LAB);  Surgeon: Sherren Mocha, MD;  Location: [facility];  Service: Cardiovascular;  Laterality: N/A;  . ELECTROCARDIOGRAM  2015   murmur  . HERNIA REPAIR     childhood  . TONSILLECTOMY     Childhood    There were no vitals filed for this visit.   Subjective Assessment - 01/11/18 1424    Subjective  I have had a murmur for about 30 years and have been having ECGs all along. I walk 4-8 miles/day and do yoga daily. Mild SOB and tightness with strenuous hiking. occasional heart beat that doesn't feel quite right.     Currently in Pain?  No/denies         [facility] PT Assessment - 01/11/18 0001      Assessment   Medical Diagnosis  severe aortic stenosis    Referring Provider  Sherren Mocha, MD    Onset Date/Surgical Date  --   30 years ago found murmur   Hand Dominance  Right      Balance Screen   Has the patient fallen in the past 6 months  No      [place] residence    Additional Comments   stairs at home and in office      Prior Function   Level of [place]  Retired      Associate Professor   Overall Cognitive Status  Within Functional Limits for tasks assessed      Sensation   Additional Comments  WFL      ROM / Strength   AROM / PROM / Strength  AROM;Strength      AROM   Overall AROM Comments  WFL      Strength   Overall Strength Comments  gross 5/5    Strength Assessment Site  Hand    Right/Left hand  Right;Left    Right Hand Grip (lbs)  75    Left Hand Grip (lbs)  75      Flexibility   Soft Tissue Assessment /Muscle Length  yes    Hamstrings  limited bilaterally      Palpation   Palpation comment  mild rounded shoulders with forward head       [facility] Pre-Surgical Assessment - 01/11/18 0001    5 Meter Walk Test- trial 1  2 sec    5 Meter Walk Test- trial 2  2 sec.     5 Meter Walk Test- trial 3  2 sec.    5 meter walk test average  2 sec    4 Stage Balance Test Position  4    Sit To Stand Test- trial 1  13 sec.    6 Minute Walk- Baseline  yes    BP (mmHg)  155/83    HR (bpm)  (!) 49    02 Sat (%RA)  97 %    Modified Borg Scale for Dyspnea  0- Nothing at all    Perceived Rate of Exertion (Borg)  6-    6 Minute Walk Post Test  yes    BP (mmHg)  168/76    HR (bpm)  87    02 Sat (%RA)  97 %    Modified Borg Scale for Dyspnea  0- Nothing at all    Perceived Rate of Exertion (Borg)  6-    Aerobic Endurance Distance Walked  1665    Endurance additional comments  age norm 77, 3.7% disability             Clinical Impression Statement: Pt is a 77 yo M presenting to OP PT for evaluation prior to possible TAVR surgery due to severe aortic stenosis. Pt reports onset of mild tightness and SOB with onset of heart murmur 30 years ago. Symptoms are not limiting function. Pt presents with WFL ROM and strength and denies musculoskeletal pain.Pt ambulated a total of 1665 feet in 6 minute walk and reported 0/10 SOB on modified scale for  dyspena and 11/20 RPE on Borg's perceived exertion and pain scale at the end of the walk. During the 6 minute walk test, patient's HR increased to 87 BPM and O2 saturation decreased to 97%. Based on the Short Physical Performance Battery, patient has a frailty rating of 11/12 with </= 5/12 considered frail.                          Plan - 01/11/18 1506    PT Frequency  --   one-time TAVR evaluation   Consulted and Agree with Plan of Care  Patient;Family member/caregiver    Family Member Consulted  Spouse        Visit Diagnosis: Other abnormalities of gait and mobility     Problem List Patient Active Problem List   Diagnosis Date Noted  . Severe aortic stenosis 12/28/2017   Aadit Hagood C. Sharaine Delange PT, DPT 01/11/18 3:13 PM   [facility] [address] [place], [place], [address] Phone: [phone]   Fax:  [phone]  Name: Jared Norton. MRN: [id number] Date of Birth: 29-Jul-1940

## 2018-01-12 ENCOUNTER — Other Ambulatory Visit: Payer: Self-pay

## 2018-01-12 ENCOUNTER — Telehealth: Payer: Self-pay | Admitting: Cardiovascular Disease

## 2018-01-12 DIAGNOSIS — I350 Nonrheumatic aortic (valve) stenosis: Secondary | ICD-10-CM

## 2018-01-12 NOTE — Telephone Encounter (Signed)
New Message:   Patient is questioning if he can get a flu shot before his appt 02/01/18

## 2018-01-12 NOTE — Telephone Encounter (Signed)
I spoke with the pt and advised him that he can proceed with flu vaccination prior to 02/01/18 TAVR.  The pt will get flu shot this weekend.

## 2018-01-19 ENCOUNTER — Institutional Professional Consult (permissible substitution): Payer: Medicare Other | Admitting: Cardiovascular Disease

## 2018-01-28 ENCOUNTER — Encounter (HOSPITAL_COMMUNITY): Payer: Self-pay

## 2018-01-28 ENCOUNTER — Encounter (HOSPITAL_COMMUNITY)
Admission: RE | Admit: 2018-01-28 | Discharge: 2018-01-28 | Disposition: A | Discharge status: Home / Self Care | Payer: Medicare Other | Source: Ambulatory Visit | Attending: Cardiovascular Disease | Admitting: Cardiovascular Disease

## 2018-01-28 DIAGNOSIS — H269 Unspecified cataract: Secondary | ICD-10-CM | POA: Diagnosis not present

## 2018-01-28 DIAGNOSIS — R001 Bradycardia, unspecified: Secondary | ICD-10-CM | POA: Insufficient documentation

## 2018-01-28 DIAGNOSIS — Z01818 Encounter for other preprocedural examination: Secondary | ICD-10-CM | POA: Insufficient documentation

## 2018-01-28 DIAGNOSIS — Z01810 Encounter for preprocedural cardiovascular examination: Secondary | ICD-10-CM | POA: Diagnosis present

## 2018-01-28 DIAGNOSIS — Z01812 Encounter for preprocedural laboratory examination: Secondary | ICD-10-CM | POA: Diagnosis present

## 2018-01-28 DIAGNOSIS — I350 Nonrheumatic aortic (valve) stenosis: Secondary | ICD-10-CM | POA: Diagnosis not present

## 2018-01-28 LAB — CBC
HCT: 42.9 % (ref 39.0–52.0)
Hemoglobin: 14.3 g/dL (ref 13.0–17.0)
MCH: 33.3 pg (ref 26.0–34.0)
MCHC: 33.3 g/dL (ref 30.0–36.0)
MCV: 100 fL (ref 78.0–100.0)
PLATELETS: 131 10*3/uL — AB (ref 150–400)
RBC: 4.29 MIL/uL (ref 4.22–5.81)
RDW: 12.7 % (ref 11.5–15.5)
WBC: 4.9 10*3/uL (ref 4.0–10.5)

## 2018-01-28 LAB — BLOOD GAS, ARTERIAL
Acid-Base Excess: 1.5 mmol/L (ref 0.0–2.0)
BICARBONATE: 25.3 mmol/L (ref 20.0–28.0)
Drawn by: 421801
FIO2: 21
O2 Saturation: 98 %
PCO2 ART: 38.1 mmHg (ref 32.0–48.0)
PH ART: 7.437 (ref 7.350–7.450)
Patient temperature: 98.6
pO2, Arterial: 107 mmHg (ref 83.0–108.0)

## 2018-01-28 LAB — URINALYSIS, ROUTINE W REFLEX MICROSCOPIC
BILIRUBIN URINE: NEGATIVE
Glucose, UA: NEGATIVE mg/dL
Hgb urine dipstick: NEGATIVE
Ketones, ur: NEGATIVE mg/dL
Leukocytes, UA: NEGATIVE
NITRITE: NEGATIVE
Protein, ur: NEGATIVE mg/dL
SPECIFIC GRAVITY, URINE: 1.021 (ref 1.005–1.030)
pH: 5 (ref 5.0–8.0)

## 2018-01-28 LAB — COMPREHENSIVE METABOLIC PANEL
ALT: 27 U/L (ref 0–44)
AST: 33 U/L (ref 15–41)
Albumin: 4 g/dL (ref 3.5–5.0)
Alkaline Phosphatase: 36 U/L — ABNORMAL LOW (ref 38–126)
Anion gap: 9 (ref 5–15)
BUN: 20 mg/dL (ref 8–23)
CALCIUM: 9.3 mg/dL (ref 8.9–10.3)
CO2: 21 mmol/L — AB (ref 22–32)
Chloride: 105 mmol/L (ref 98–111)
Creatinine, Ser: 0.97 mg/dL (ref 0.61–1.24)
GFR calc non Af Amer: >60 mL/min (ref >60)
Glucose, Bld: 94 mg/dL (ref 70–99)
Potassium: 4.5 mmol/L (ref 3.5–5.1)
SODIUM: 135 mmol/L (ref 135–145)
Total Bilirubin: 1 mg/dL (ref 0.3–1.2)
Total Protein: 6.7 g/dL (ref 6.5–8.1)

## 2018-01-28 LAB — PROTIME-INR
INR: 1.06
PROTHROMBIN TIME: 13.8 s (ref 11.4–15.2)

## 2018-01-28 LAB — ABO/RH: ABO/RH(D): O POS

## 2018-01-28 LAB — HEMOGLOBIN A1C
HEMOGLOBIN A1C: 5.8 % — AB (ref 4.8–5.6)
MEAN PLASMA GLUCOSE: 119.76 mg/dL

## 2018-01-28 LAB — SURGICAL PCR SCREEN
MRSA, PCR: NEGATIVE
STAPHYLOCOCCUS AUREUS: NEGATIVE

## 2018-01-28 LAB — APTT: aPTT: 32 seconds (ref 24–36)

## 2018-01-28 LAB — BRAIN NATRIURETIC PEPTIDE: B NATRIURETIC PEPTIDE 5: 334.6 pg/mL — AB (ref 0.0–100.0)

## 2018-01-28 NOTE — Pre-Procedure Instructions (Signed)
Jared Reisch Jr.  01/28/2018      [id number] [address], [place] [address] Phone: [phone] Fax: [phone]    Your procedure is scheduled on 02/01/18.  Report to [facility] Admitting at 530 A.M.  Call this number if you have problems the morning of surgery:  [phone]   Remember:  Do not eat or drink after midnight.   Take these medicines the morning of surgery with A SIP OF WATER --none    Do not wear jewelry, make-up or nail polish.  Do not wear lotions, powders, or perfumes, or deodorant.  Do not shave 48 hours prior to surgery.  Men may shave face and neck.  Do not bring valuables to the hospital.  [facility] is not responsible for any belongings or valuables.  Contacts, dentures or bridgework may not be worn into surgery.  Leave your suitcase in the car.  After surgery it may be brought to your room.  For patients admitted to the hospital, discharge time will be determined by your treatment team.  Patients discharged the day of surgery will not be allowed to drive home.   Name and phone number of your driver:   Do not take any aspirin,anti-inflammatories,vitamins,or herbal supplements 5-7 days prior to surgery. Special instructions:  [place] [place] - Preparing for Surgery  Before surgery, you can play an important role.  Because skin is not sterile, your skin needs to be as free of germs as possible.  You can reduce the number of germs on you skin by washing with CHG (chlorahexidine gluconate) soap before surgery.  CHG is an antiseptic cleaner which kills germs and bonds with the skin to continue killing germs even after washing.  Oral Hygiene is also important in reducing the risk of infection.  Remember to brush your teeth with your regular toothpaste the morning of surgery.  Please DO NOT use if you have an allergy to CHG or antibacterial  soaps.  If your skin becomes reddened/irritated stop using the CHG and inform your nurse when you arrive at Short Stay.  Do not shave (including legs and underarms) for at least 48 hours prior to the first CHG shower.  You may shave your face.  Please follow these instructions carefully:   1.  Shower with CHG Soap the night before surgery and the morning of Surgery.  2.  If you choose to wash your hair, wash your hair first as usual with your normal shampoo.  3.  After you shampoo, rinse your hair and body thoroughly to remove the shampoo. 4.  Use CHG as you would any other liquid soap.  You can apply chg directly to the skin and wash gently with a      scrungie or washcloth.           5.  Apply the CHG Soap to your body ONLY FROM THE NECK DOWN.   Do not use on open wounds or open sores. Avoid contact with your eyes, ears, mouth and genitals (private parts).  Wash genitals (private parts) with your normal soap.  6.  Wash thoroughly, paying special attention to the area where your surgery will be performed.  7.  Thoroughly rinse your body with warm water from the neck down.  8.  DO NOT shower/wash with your normal soap after using and rinsing off the CHG Soap.  9.  Pat yourself dry with a clean towel.            10.  Wear clean pajamas.            11.  Place clean sheets on your bed the night of your first shower and do not sleep with pets.  Day of Surgery  Do not apply any lotions/deoderants the morning of surgery.   Please wear clean clothes to the hospital/surgery center. Remember to brush your teeth with toothpaste.    Please read over the following fact sheets that you were given. MRSA Information

## 2018-01-31 MED ORDER — PHENYLEPHRINE HCL-NACL 20-0.9 MG/250ML-% IV SOLN
30.0000 ug/min | INTRAVENOUS | Status: DC
  Filled 2018-01-31: qty 250

## 2018-01-31 MED ORDER — SODIUM CHLORIDE 0.9 % IV SOLN
INTRAVENOUS | Status: DC
  Filled 2018-01-31: qty 1

## 2018-01-31 MED ORDER — VANCOMYCIN HCL 10 G IV SOLR
1250.0000 mg | INTRAVENOUS | Status: AC
  Administered 2018-02-01: 1250 mg via INTRAVENOUS
  Filled 2018-01-31: qty 1250

## 2018-01-31 MED ORDER — EPINEPHRINE PF 1 MG/ML IJ SOLN
0.0000 ug/min | INTRAVENOUS | Status: DC
  Filled 2018-01-31: qty 4

## 2018-01-31 MED ORDER — MAGNESIUM SULFATE 50 % IJ SOLN
40.0000 meq | INTRAMUSCULAR | Status: DC
  Filled 2018-01-31: qty 9.85

## 2018-01-31 MED ORDER — DEXMEDETOMIDINE HCL IN NACL 400 MCG/100ML IV SOLN
0.1000 ug/kg/h | INTRAVENOUS | Status: AC
  Administered 2018-02-01: 1 ug/kg/h via INTRAVENOUS
  Filled 2018-01-31: qty 100

## 2018-01-31 MED ORDER — SODIUM CHLORIDE 0.9 % IV SOLN
INTRAVENOUS | Status: DC
  Filled 2018-01-31: qty 30

## 2018-01-31 MED ORDER — DOPAMINE-DEXTROSE 3.2-5 MG/ML-% IV SOLN
0.0000 ug/kg/min | INTRAVENOUS | Status: DC
  Filled 2018-01-31: qty 250

## 2018-01-31 MED ORDER — POTASSIUM CHLORIDE 2 MEQ/ML IV SOLN
80.0000 meq | INTRAVENOUS | Status: DC
  Filled 2018-01-31: qty 40

## 2018-01-31 MED ORDER — NITROGLYCERIN IN D5W 200-5 MCG/ML-% IV SOLN
2.0000 ug/min | INTRAVENOUS | Status: DC
  Filled 2018-01-31: qty 250

## 2018-01-31 MED ORDER — SODIUM CHLORIDE 0.9 % IV SOLN
1.5000 g | INTRAVENOUS | Status: AC
  Administered 2018-02-01: 1.5 g via INTRAVENOUS
  Filled 2018-01-31: qty 1.5

## 2018-01-31 MED ORDER — NOREPINEPHRINE 4 MG/250ML-% IV SOLN
0.0000 ug/min | INTRAVENOUS | Status: DC
  Filled 2018-01-31: qty 250

## 2018-02-01 ENCOUNTER — Other Ambulatory Visit: Payer: Self-pay

## 2018-02-01 ENCOUNTER — Inpatient Hospital Stay (HOSPITAL_COMMUNITY): Payer: Medicare Other | Admitting: Vascular Surgery

## 2018-02-01 ENCOUNTER — Inpatient Hospital Stay (HOSPITAL_COMMUNITY): Payer: Medicare Other

## 2018-02-01 ENCOUNTER — Inpatient Hospital Stay (HOSPITAL_COMMUNITY)
Admission: RE | Admit: 2018-02-01 | Discharge: 2018-02-02 | DRG: 267 | Disposition: A | Discharge status: Home / Self Care | Payer: Medicare Other | Source: Ambulatory Visit | Attending: Cardiovascular Disease | Admitting: Cardiovascular Disease

## 2018-02-01 ENCOUNTER — Other Ambulatory Visit: Payer: Self-pay | Admitting: Physician Assistant

## 2018-02-01 ENCOUNTER — Encounter (HOSPITAL_COMMUNITY)
Admission: RE | Disposition: A | Discharge status: Home / Self Care | Payer: Self-pay | Source: Ambulatory Visit | Attending: Cardiovascular Disease

## 2018-02-01 ENCOUNTER — Encounter (HOSPITAL_COMMUNITY): Payer: Self-pay

## 2018-02-01 ENCOUNTER — Other Ambulatory Visit (HOSPITAL_COMMUNITY): Payer: Medicare Other

## 2018-02-01 DIAGNOSIS — Z79899 Other long term (current) drug therapy: Secondary | ICD-10-CM | POA: Diagnosis not present

## 2018-02-01 DIAGNOSIS — I350 Nonrheumatic aortic (valve) stenosis: Principal | ICD-10-CM | POA: Diagnosis present

## 2018-02-01 DIAGNOSIS — Z9842 Cataract extraction status, left eye: Secondary | ICD-10-CM

## 2018-02-01 DIAGNOSIS — I454 Nonspecific intraventricular block: Secondary | ICD-10-CM

## 2018-02-01 DIAGNOSIS — R918 Other nonspecific abnormal finding of lung field: Secondary | ICD-10-CM | POA: Diagnosis present

## 2018-02-01 DIAGNOSIS — J9601 Acute respiratory failure with hypoxia: Secondary | ICD-10-CM | POA: Diagnosis not present

## 2018-02-01 DIAGNOSIS — Z006 Encounter for examination for normal comparison and control in clinical research program: Secondary | ICD-10-CM

## 2018-02-01 DIAGNOSIS — I700 Atherosclerosis of aorta: Secondary | ICD-10-CM | POA: Diagnosis present

## 2018-02-01 DIAGNOSIS — Z952 Presence of prosthetic heart valve: Secondary | ICD-10-CM

## 2018-02-01 DIAGNOSIS — I440 Atrioventricular block, first degree: Secondary | ICD-10-CM | POA: Diagnosis not present

## 2018-02-01 DIAGNOSIS — Z9841 Cataract extraction status, right eye: Secondary | ICD-10-CM | POA: Diagnosis not present

## 2018-02-01 DIAGNOSIS — I340 Nonrheumatic mitral (valve) insufficiency: Secondary | ICD-10-CM | POA: Diagnosis not present

## 2018-02-01 DIAGNOSIS — R001 Bradycardia, unspecified: Secondary | ICD-10-CM | POA: Diagnosis not present

## 2018-02-01 DIAGNOSIS — Z888 Allergy status to other drugs, medicaments and biological substances status: Secondary | ICD-10-CM

## 2018-02-01 DIAGNOSIS — I639 Cerebral infarction, unspecified: Secondary | ICD-10-CM | POA: Diagnosis not present

## 2018-02-01 DIAGNOSIS — I447 Left bundle-branch block, unspecified: Secondary | ICD-10-CM | POA: Diagnosis not present

## 2018-02-01 DIAGNOSIS — I2510 Atherosclerotic heart disease of native coronary artery without angina pectoris: Secondary | ICD-10-CM | POA: Diagnosis present

## 2018-02-01 DIAGNOSIS — I080 Rheumatic disorders of both mitral and aortic valves: Secondary | ICD-10-CM | POA: Diagnosis present

## 2018-02-01 DIAGNOSIS — I442 Atrioventricular block, complete: Secondary | ICD-10-CM | POA: Diagnosis not present

## 2018-02-01 DIAGNOSIS — I97638 Postprocedural hematoma of a circulatory system organ or structure following other circulatory system procedure: Secondary | ICD-10-CM | POA: Diagnosis not present

## 2018-02-01 DIAGNOSIS — R578 Other shock: Secondary | ICD-10-CM | POA: Diagnosis not present

## 2018-02-01 HISTORY — DX: Adverse effect of unspecified anesthetic, initial encounter: T41.45XA

## 2018-02-01 HISTORY — DX: Other complications of anesthesia, initial encounter: T88.59XA

## 2018-02-01 HISTORY — DX: Presence of prosthetic heart valve: Z95.2

## 2018-02-01 HISTORY — PX: INTRAOPERATIVE TRANSTHORACIC ECHOCARDIOGRAM: SHX6523

## 2018-02-01 HISTORY — PX: TRANSCATHETER AORTIC VALVE REPLACEMENT, TRANSFEMORAL: SHX6400

## 2018-02-01 LAB — POCT I-STAT, CHEM 8
BUN: 16 mg/dL (ref 8–23)
BUN: 15 mg/dL (ref 8–23)
BUN: 14 mg/dL (ref 8–23)
BUN: 14 mg/dL (ref 8–23)
CALCIUM ION: 1.21 mmol/L (ref 1.15–1.40)
CALCIUM ION: 1.14 mmol/L — AB (ref 1.15–1.40)
CHLORIDE: 104 mmol/L (ref 98–111)
CHLORIDE: 104 mmol/L (ref 98–111)
CREATININE: 0.7 mg/dL (ref 0.61–1.24)
Calcium, Ion: 1.19 mmol/L (ref 1.15–1.40)
Calcium, Ion: 1.16 mmol/L (ref 1.15–1.40)
Chloride: 105 mmol/L (ref 98–111)
Chloride: 106 mmol/L (ref 98–111)
Creatinine, Ser: 0.8 mg/dL (ref 0.61–1.24)
Creatinine, Ser: 0.8 mg/dL (ref 0.61–1.24)
Creatinine, Ser: 0.8 mg/dL (ref 0.61–1.24)
GLUCOSE: 115 mg/dL — AB (ref 70–99)
Glucose, Bld: 131 mg/dL — ABNORMAL HIGH (ref 70–99)
Glucose, Bld: 137 mg/dL — ABNORMAL HIGH (ref 70–99)
Glucose, Bld: 140 mg/dL — ABNORMAL HIGH (ref 70–99)
HCT: 35 % — ABNORMAL LOW (ref 39.0–52.0)
HEMATOCRIT: 35 % — AB (ref 39.0–52.0)
HEMATOCRIT: 33 % — AB (ref 39.0–52.0)
HEMATOCRIT: 33 % — AB (ref 39.0–52.0)
HEMOGLOBIN: 11.9 g/dL — AB (ref 13.0–17.0)
Hemoglobin: 11.9 g/dL — ABNORMAL LOW (ref 13.0–17.0)
Hemoglobin: 11.2 g/dL — ABNORMAL LOW (ref 13.0–17.0)
Hemoglobin: 11.2 g/dL — ABNORMAL LOW (ref 13.0–17.0)
POTASSIUM: 4.1 mmol/L (ref 3.5–5.1)
POTASSIUM: 4.1 mmol/L (ref 3.5–5.1)
Potassium: 4 mmol/L (ref 3.5–5.1)
Potassium: 4.2 mmol/L (ref 3.5–5.1)
SODIUM: 139 mmol/L (ref 135–145)
SODIUM: 140 mmol/L (ref 135–145)
Sodium: 138 mmol/L (ref 135–145)
Sodium: 139 mmol/L (ref 135–145)
TCO2: 25 mmol/L (ref 22–32)
TCO2: 26 mmol/L (ref 22–32)
TCO2: 23 mmol/L (ref 22–32)
TCO2: 26 mmol/L (ref 22–32)

## 2018-02-01 SURGERY — IMPLANTATION, AORTIC VALVE, TRANSCATHETER, FEMORAL APPROACH
Anesthesia: Monitor Anesthesia Care | Site: Chest

## 2018-02-01 MED ORDER — MIDAZOLAM HCL 2 MG/2ML IJ SOLN
INTRAMUSCULAR | Status: AC
  Filled 2018-02-01: qty 2

## 2018-02-01 MED ORDER — SODIUM CHLORIDE 0.9 % IV SOLN
INTRAVENOUS | Status: AC
  Filled 2018-02-01 (×3): qty 1.2

## 2018-02-01 MED ORDER — PROPOFOL 10 MG/ML IV BOLUS
INTRAVENOUS | Status: DC | PRN
  Administered 2018-02-01 (×2): 10 mg via INTRAVENOUS

## 2018-02-01 MED ORDER — LIDOCAINE HCL 1 % IJ SOLN
INTRAMUSCULAR | Status: AC
  Filled 2018-02-01: qty 20

## 2018-02-01 MED ORDER — ACETAMINOPHEN 325 MG PO TABS: 650.0000 mg | ORAL_TABLET | Freq: Four times a day (QID) | ORAL | Status: DC | PRN

## 2018-02-01 MED ORDER — PROPOFOL 10 MG/ML IV BOLUS
INTRAVENOUS | Status: AC
  Filled 2018-02-01: qty 20

## 2018-02-01 MED ORDER — ONDANSETRON HCL 4 MG/2ML IJ SOLN
INTRAMUSCULAR | Status: AC
  Filled 2018-02-01: qty 2

## 2018-02-01 MED ORDER — SODIUM CHLORIDE 0.9% FLUSH: 3.0000 mL | INTRAVENOUS | Status: DC | PRN

## 2018-02-01 MED ORDER — PHENYLEPHRINE HCL-NACL 20-0.9 MG/250ML-% IV SOLN
0.0000 ug/min | INTRAVENOUS | Status: DC
  Filled 2018-02-01: qty 250

## 2018-02-01 MED ORDER — DEXMEDETOMIDINE HCL 200 MCG/2ML IV SOLN
INTRAVENOUS | Status: DC | PRN
  Administered 2018-02-01: 63.2 ug via INTRAVENOUS

## 2018-02-01 MED ORDER — CHLORHEXIDINE GLUCONATE 4 % EX LIQD: 30.0000 mL | CUTANEOUS | Status: DC

## 2018-02-01 MED ORDER — PROTAMINE SULFATE 10 MG/ML IV SOLN
INTRAVENOUS | Status: DC | PRN
  Administered 2018-02-01: 70 mg via INTRAVENOUS

## 2018-02-01 MED ORDER — SODIUM CHLORIDE 0.9 % IV SOLN: 250.0000 mL | INTRAVENOUS | Status: DC | PRN

## 2018-02-01 MED ORDER — SODIUM CHLORIDE 0.9% FLUSH: 3.0000 mL | Freq: Two times a day (BID) | INTRAVENOUS | Status: DC

## 2018-02-01 MED ORDER — LIDOCAINE HCL 1 % IJ SOLN
INTRAMUSCULAR | Status: DC | PRN
  Administered 2018-02-01: 2 mL

## 2018-02-01 MED ORDER — HEPARIN SODIUM (PORCINE) 1000 UNIT/ML IJ SOLN
INTRAMUSCULAR | Status: DC | PRN
  Administered 2018-02-01: 7000 [IU] via INTRAVENOUS

## 2018-02-01 MED ORDER — IODIXANOL 320 MG/ML IV SOLN
INTRAVENOUS | Status: DC | PRN
  Administered 2018-02-01: 80.6 mL via INTRAVENOUS

## 2018-02-01 MED ORDER — PHENOL 1.4 % MT LIQD
1.0000 | OROMUCOSAL | Status: DC | PRN
  Filled 2018-02-01: qty 177

## 2018-02-01 MED ORDER — PROPOFOL 500 MG/50ML IV EMUL
INTRAVENOUS | Status: DC | PRN
  Administered 2018-02-01: 10 ug/kg/min via INTRAVENOUS

## 2018-02-01 MED ORDER — SODIUM CHLORIDE 0.9 % IV SOLN: INTRAVENOUS | Status: DC

## 2018-02-01 MED ORDER — CLOPIDOGREL BISULFATE 75 MG PO TABS
75.0000 mg | ORAL_TABLET | Freq: Every day | ORAL | Status: DC
  Administered 2018-02-02: 75 mg via ORAL
  Filled 2018-02-01: qty 1

## 2018-02-01 MED ORDER — SODIUM CHLORIDE 0.9 % IV SOLN
INTRAVENOUS | Status: AC
  Administered 2018-02-01 (×2): via INTRAVENOUS

## 2018-02-01 MED ORDER — GLYCOPYRROLATE 0.2 MG/ML IJ SOLN
INTRAMUSCULAR | Status: DC | PRN
  Administered 2018-02-01 (×3): 0.1 mg via INTRAVENOUS

## 2018-02-01 MED ORDER — TRAMADOL HCL 50 MG PO TABS: 50.0000 mg | ORAL_TABLET | ORAL | Status: DC | PRN

## 2018-02-01 MED ORDER — SODIUM CHLORIDE 0.9 % IV SOLN
1.5000 g | Freq: Two times a day (BID) | INTRAVENOUS | Status: DC
  Administered 2018-02-01 – 2018-02-02 (×2): 1.5 g via INTRAVENOUS
  Filled 2018-02-01 (×3): qty 1.5

## 2018-02-01 MED ORDER — MIDAZOLAM HCL 5 MG/5ML IJ SOLN
INTRAMUSCULAR | Status: DC | PRN
  Administered 2018-02-01: 1 mg via INTRAVENOUS
  Administered 2018-02-01: 0.5 mg via INTRAVENOUS

## 2018-02-01 MED ORDER — ASPIRIN 81 MG PO CHEW
81.0000 mg | CHEWABLE_TABLET | Freq: Every day | ORAL | Status: DC
  Administered 2018-02-02: 81 mg via ORAL
  Filled 2018-02-01: qty 1

## 2018-02-01 MED ORDER — ONDANSETRON HCL 4 MG/2ML IJ SOLN: 4.0000 mg | Freq: Four times a day (QID) | INTRAMUSCULAR | Status: DC | PRN

## 2018-02-01 MED ORDER — CHLORHEXIDINE GLUCONATE 0.12 % MT SOLN: 15.0000 mL | Freq: Once | OROMUCOSAL | Status: DC

## 2018-02-01 MED ORDER — MORPHINE SULFATE (PF) 2 MG/ML IV SOLN: 2.0000 mg | INTRAVENOUS | Status: DC | PRN

## 2018-02-01 MED ORDER — GLYCOPYRROLATE PF 0.2 MG/ML IJ SOSY
PREFILLED_SYRINGE | INTRAMUSCULAR | Status: AC
  Filled 2018-02-01: qty 1

## 2018-02-01 MED ORDER — METOPROLOL TARTRATE 5 MG/5ML IV SOLN: 2.5000 mg | INTRAVENOUS | Status: DC | PRN

## 2018-02-01 MED ORDER — PHENYLEPHRINE 40 MCG/ML (10ML) SYRINGE FOR IV PUSH (FOR BLOOD PRESSURE SUPPORT)
PREFILLED_SYRINGE | INTRAVENOUS | Status: AC
  Filled 2018-02-01: qty 10

## 2018-02-01 MED ORDER — CHLORHEXIDINE GLUCONATE 4 % EX LIQD: 60.0000 mL | Freq: Once | CUTANEOUS | Status: DC

## 2018-02-01 MED ORDER — SODIUM CHLORIDE 0.9 % IV SOLN
INTRAVENOUS | Status: DC | PRN
  Administered 2018-02-01: 07:00:00 via INTRAVENOUS

## 2018-02-01 MED ORDER — FENTANYL CITRATE (PF) 100 MCG/2ML IJ SOLN
INTRAMUSCULAR | Status: DC | PRN
  Administered 2018-02-01 (×2): 50 ug via INTRAVENOUS

## 2018-02-01 MED ORDER — NITROGLYCERIN IN D5W 200-5 MCG/ML-% IV SOLN: 0.0000 ug/min | INTRAVENOUS | Status: DC

## 2018-02-01 MED ORDER — ACETAMINOPHEN 650 MG RE SUPP: 650.0000 mg | Freq: Four times a day (QID) | RECTAL | Status: DC | PRN

## 2018-02-01 MED ORDER — SODIUM CHLORIDE 0.9 % IV SOLN
INTRAVENOUS | Status: DC | PRN
  Administered 2018-02-01: 1500 mL

## 2018-02-01 MED ORDER — PROTAMINE SULFATE 10 MG/ML IV SOLN
INTRAVENOUS | Status: AC
  Filled 2018-02-01: qty 25

## 2018-02-01 MED ORDER — FENTANYL CITRATE (PF) 250 MCG/5ML IJ SOLN
INTRAMUSCULAR | Status: AC
  Filled 2018-02-01: qty 5

## 2018-02-01 MED ORDER — VANCOMYCIN HCL IN DEXTROSE 1-5 GM/200ML-% IV SOLN
1000.0000 mg | Freq: Once | INTRAVENOUS | Status: AC
  Administered 2018-02-01: 1000 mg via INTRAVENOUS
  Filled 2018-02-01: qty 200

## 2018-02-01 MED ORDER — ONDANSETRON HCL 4 MG/2ML IJ SOLN
INTRAMUSCULAR | Status: DC | PRN
  Administered 2018-02-01: 4 mg via INTRAVENOUS

## 2018-02-01 MED ORDER — OXYCODONE HCL 5 MG PO TABS: 5.0000 mg | ORAL_TABLET | ORAL | Status: DC | PRN

## 2018-02-01 SURGICAL SUPPLY — 92 items
BAG DECANTER FOR FLEXI CONT (MISCELLANEOUS) IMPLANT
BAG SNAP BAND KOVER 36X36 (MISCELLANEOUS) ×3 IMPLANT
BLADE CLIPPER SURG (BLADE) IMPLANT
BLADE STERNUM SYSTEM 6 (BLADE) IMPLANT
CABLE ADAPT CONN TEMP 6FT (ADAPTER) ×3 IMPLANT
CANISTER SUCT 3000ML PPV (MISCELLANEOUS) IMPLANT
CANNULA FEM VENOUS REMOTE 22FR (CANNULA) IMPLANT
CANNULA OPTISITE PERFUSION 16F (CANNULA) IMPLANT
CANNULA OPTISITE PERFUSION 18F (CANNULA) IMPLANT
CATH DIAG EXPO 6F VENT PIG 145 (CATHETERS) ×9 IMPLANT
CATH EXPO 5FR AL1 (CATHETERS) ×3 IMPLANT
CATH FELDMAN FA1 6F 100CM (CATHETERS) ×3 IMPLANT
CATH INFINITI 6F AL2 (CATHETERS) IMPLANT
CATH INFINITI 6F MPB2 (CATHETERS) ×3 IMPLANT
CATH S G BIP PACING (SET/KITS/TRAYS/PACK) ×3 IMPLANT
CLIP VESOCCLUDE MED 24/CT (CLIP) ×3 IMPLANT
CLIP VESOCCLUDE SM WIDE 24/CT (CLIP) ×3 IMPLANT
CONT SPEC 4OZ CLIKSEAL STRL BL (MISCELLANEOUS) ×6 IMPLANT
COVER BACK TABLE 80X110 HD (DRAPES) IMPLANT
COVER DOME SNAP 22 D (MISCELLANEOUS) IMPLANT
COVER WAND RF STERILE (DRAPES) ×3 IMPLANT
CRADLE DONUT ADULT HEAD (MISCELLANEOUS) ×3 IMPLANT
DERMABOND ADHESIVE PROPEN (GAUZE/BANDAGES/DRESSINGS) ×1
DERMABOND ADVANCED (GAUZE/BANDAGES/DRESSINGS) ×1
DERMABOND ADVANCED .7 DNX12 (GAUZE/BANDAGES/DRESSINGS) ×2 IMPLANT
DERMABOND ADVANCED .7 DNX6 (GAUZE/BANDAGES/DRESSINGS) ×2 IMPLANT
DEVICE CLOSURE PERCLS PRGLD 6F (VASCULAR PRODUCTS) ×4 IMPLANT
DRAPE INCISE IOBAN 66X45 STRL (DRAPES) IMPLANT
DRSG TEGADERM 4X4.75 (GAUZE/BANDAGES/DRESSINGS) ×6 IMPLANT
ELECT CAUTERY BLADE 6.4 (BLADE) IMPLANT
ELECT REM PT RETURN 9FT ADLT (ELECTROSURGICAL) ×6
ELECTRODE REM PT RTRN 9FT ADLT (ELECTROSURGICAL) ×4 IMPLANT
FELT TEFLON 6X6 (MISCELLANEOUS) ×3 IMPLANT
FEMORAL VENOUS CANN RAP (CANNULA) IMPLANT
GAUZE SPONGE 4X4 12PLY STRL (GAUZE/BANDAGES/DRESSINGS) ×3 IMPLANT
GLOVE BIO SURGEON STRL SZ7.5 (GLOVE) IMPLANT
GLOVE BIO SURGEON STRL SZ8 (GLOVE) IMPLANT
GLOVE EUDERMIC 7 POWDERFREE (GLOVE) IMPLANT
GLOVE ORTHO TXT STRL SZ7.5 (GLOVE) IMPLANT
GOWN STRL REUS W/ TWL LRG LVL3 (GOWN DISPOSABLE) IMPLANT
GOWN STRL REUS W/ TWL XL LVL3 (GOWN DISPOSABLE) ×2 IMPLANT
GOWN STRL REUS W/TWL LRG LVL3 (GOWN DISPOSABLE)
GOWN STRL REUS W/TWL XL LVL3 (GOWN DISPOSABLE) ×1
GUIDEWIRE CNFDA BRKR CVD (WIRE) ×3 IMPLANT
GUIDEWIRE SAF TJ AMPL .035X180 (WIRE) ×3 IMPLANT
GUIDEWIRE SAFE TJ AMPLATZ EXST (WIRE) ×6 IMPLANT
GUIDEWIRE STRAIGHT .035 260CM (WIRE) ×3 IMPLANT
INSERT FOGARTY SM (MISCELLANEOUS) IMPLANT
KIT BASIN OR (CUSTOM PROCEDURE TRAY) ×3 IMPLANT
KIT DILATOR VASC 18G NDL (KITS) IMPLANT
KIT HEART LEFT (KITS) ×3 IMPLANT
KIT SUCTION CATH 14FR (SUCTIONS) ×6 IMPLANT
KIT TURNOVER KIT B (KITS) ×3 IMPLANT
LOOP VESSEL MAXI BLUE (MISCELLANEOUS) IMPLANT
LOOP VESSEL MINI RED (MISCELLANEOUS) IMPLANT
NEEDLE PERC 18GX7CM (NEEDLE) ×3 IMPLANT
NS IRRIG 1000ML POUR BTL (IV SOLUTION) ×3 IMPLANT
PACK ENDOVASCULAR (PACKS) ×3 IMPLANT
PAD ARMBOARD 7.5X6 YLW CONV (MISCELLANEOUS) ×6 IMPLANT
PAD ELECT DEFIB RADIOL ZOLL (MISCELLANEOUS) ×3 IMPLANT
PENCIL BUTTON HOLSTER BLD 10FT (ELECTRODE) IMPLANT
PERCLOSE PROGLIDE 6F (VASCULAR PRODUCTS) ×6
SET MICROPUNCTURE 5F STIFF (MISCELLANEOUS) ×3 IMPLANT
SHEATH BRITE TIP 6FR 35CM (SHEATH) ×3 IMPLANT
SHEATH PINNACLE 6F 10CM (SHEATH) IMPLANT
SHEATH PINNACLE 8F 10CM (SHEATH) ×3 IMPLANT
SLEEVE REPOSITIONING LENGTH 30 (MISCELLANEOUS) ×3 IMPLANT
SPONGE LAP 4X18 RFD (DISPOSABLE) ×3 IMPLANT
STOPCOCK MORSE 400PSI 3WAY (MISCELLANEOUS) ×9 IMPLANT
SUT ETHIBOND X763 2 0 SH 1 (SUTURE) IMPLANT
SUT GORETEX CV 4 TH 22 36 (SUTURE) IMPLANT
SUT GORETEX CV4 TH-18 (SUTURE) IMPLANT
SUT MNCRL AB 3-0 PS2 18 (SUTURE) IMPLANT
SUT PROLENE 5 0 C 1 36 (SUTURE) IMPLANT
SUT PROLENE 6 0 C 1 30 (SUTURE) IMPLANT
SUT SILK  1 MH (SUTURE) ×1
SUT SILK 1 MH (SUTURE) ×2 IMPLANT
SUT VIC AB 2-0 CT1 27 (SUTURE)
SUT VIC AB 2-0 CT1 TAPERPNT 27 (SUTURE) IMPLANT
SUT VIC AB 2-0 CTX 36 (SUTURE) IMPLANT
SUT VIC AB 3-0 SH 8-18 (SUTURE) IMPLANT
SYR 50ML LL SCALE MARK (SYRINGE) ×3 IMPLANT
SYR BULB IRRIGATION 50ML (SYRINGE) IMPLANT
SYR CONTROL 10ML LL (SYRINGE) IMPLANT
TAPE CLOTH SURG 4X10 WHT LF (GAUZE/BANDAGES/DRESSINGS) ×3 IMPLANT
TOWEL GREEN STERILE (TOWEL DISPOSABLE) ×6 IMPLANT
TRANSDUCER W/STOPCOCK (MISCELLANEOUS) ×6 IMPLANT
TRAY FOLEY SLVR 14FR TEMP STAT (SET/KITS/TRAYS/PACK) IMPLANT
TUBE SUCT INTRACARD DLP 20F (MISCELLANEOUS) IMPLANT
VALVE HEART TRANSCATH SZ3 26MM (Prosthesis & Implant Heart) ×3 IMPLANT
WIRE .035 3MM-J 145CM (WIRE) ×3 IMPLANT
WIRE BENTSON .035X145CM (WIRE) ×3 IMPLANT

## 2018-02-01 NOTE — Plan of Care (Signed)
Patient activity level is progressing well, able to tolerate frequent ambulation. Patient demonstrates good appetite, and is safety conscious. Patient educated on purpose of prophylactic antibiotics, patient expresses satisfaction with knowledge of such preventative measures.

## 2018-02-01 NOTE — Progress Notes (Signed)
Pt went for 4th walk of day independently. Approx 500 ft, RA. Tolerated well. Pt back to chair.

## 2018-02-01 NOTE — Op Note (Signed)
[facility]   MULTIDISCIPLINARY HEART VALVE TEAM   TAVR OPERATIVE NOTE   Date of Procedure:  02/01/2018  Preoperative Diagnosis: Severe Aortic Stenosis   Postoperative Diagnosis: Same   Procedure:    Transcatheter Aortic Valve Replacement - Percutaneous Transfemoral Approach  Edwards Sapien 3 THV (size 26 mm, model # 9600TFX, serial # [id number])   Co-Surgeons:  Valentina Gu. Roxy Manns, MD and Sherren Mocha, MD  Anesthesiologist:  Albertha Ghee, MD  Echocardiographer:  Sanda Klein, MD  Pre-operative Echo Findings:  Severe aortic stenosis  Normal left ventricular systolic function  Post-operative Echo Findings:  Mild-moderate paravalvular leak  Normal left ventricular systolic function  BRIEF CLINICAL NOTE AND INDICATIONS FOR SURGERY  77 year old gentleman seen for evaluation of severe symptomatic aortic stenosis.  The patient underwent extensive preoperative testing and multidisciplinary heart team evaluation.  He has no significant coronary artery disease and has normal LV systolic function.  He was felt to be a good candidate for transcatheter aortic valve replacement after review of all of his studies.  He presents today for elective TAVR.  During the course of the patient's preoperative work up they have been evaluated comprehensively by a multidisciplinary team of specialists coordinated through the [facility] in the [facility].  They have been demonstrated to suffer from symptomatic severe aortic stenosis as noted above. The patient has been counseled extensively as to the relative risks and benefits of all options for the treatment of severe aortic stenosis including long term medical therapy, conventional surgery for aortic valve replacement, and transcatheter aortic valve replacement.  The patient has been independently evaluated by the multidisciplinary heart valve team, including formal cardiac surgical  consultation by Dr. Roxy Manns.  Based upon review of all of the patient's preoperative diagnostic tests they are felt to be candidate for transcatheter aortic valve replacement using the transfemoral approach as an alternative to conventional surgery.    Following the decision to proceed with transcatheter aortic valve replacement, a discussion has been held regarding what types of management strategies would be attempted intraoperatively in the event of life-threatening complications, including whether or not the patient would be considered a candidate for the use of cardiopulmonary bypass and/or conversion to open sternotomy for attempted surgical intervention.  The patient has been advised of a variety of complications that might develop peculiar to this approach including but not limited to risks of death, stroke, paravalvular leak, aortic dissection or other major vascular complications, aortic annulus rupture, device embolization, cardiac rupture or perforation, acute myocardial infarction, arrhythmia, heart block or bradycardia requiring permanent pacemaker placement, congestive heart failure, respiratory failure, renal failure, pneumonia, infection, other late complications related to structural valve deterioration or migration, or other complications that might ultimately cause a temporary or permanent loss of functional independence or other long term morbidity.  The patient provides full informed consent for the procedure as described and all questions were answered preoperatively.  DETAILS OF THE OPERATIVE PROCEDURE  PREPARATION:   The patient is brought to the operating room on the above mentioned date and central monitoring was established by the anesthesia team including placement of a central venous catheter and radial arterial line. The patient is placed in the supine position on the operating table.  Intravenous antibiotics are administered. The patient is monitored closely throughout the  procedure under conscious sedation.  Baseline transthoracic echocardiogram is performed. The patient's chest, abdomen, both groins, and both lower extremities are prepared and draped in a sterile  manner. A time out procedure is performed.   PERIPHERAL ACCESS:   Using ultrasound guidance, femoral arterial and venous access is obtained with placement of 6 Fr sheaths on the right side.  A pigtail diagnostic catheter was passed through the femoral arterial sheath under fluoroscopic guidance into the aortic root.  A temporary transvenous pacemaker catheter was passed through the femoral venous sheath under fluoroscopic guidance into the right ventricle.  The pacemaker was tested to ensure stable lead placement and pacemaker capture. Aortic root angiography was performed in order to determine the optimal angiographic angle for valve deployment.  TRANSFEMORAL ACCESS:  A micropuncture technique is used to access the left femoral artery under fluoroscopic and ultrasound guidance.  2 Perclose devices are deployed at 10' and 2' positions to 'PreClose' the femoral artery. An 8 French sheath is placed and then an Amplatz Superstiff wire is advanced through the sheath. This is changed out for a 14 French transfemoral E-Sheath after progressively dilating over the Superstiff wire.  An AL-2 catheter was used to direct a straight-tip exchange length wire across the native aortic valve into the left ventricle.  The aortic valve was difficult to cross with a wire and multiple catheters were attempted.  The wire was exchanged out for a pigtail catheter and position was confirmed in the LV apex. Simultaneous LV and Ao pressures were recorded.  The pigtail catheter was exchanged for an Amplatz Extra-stiff wire in the LV apex.   BALLOON AORTIC VALVULOPLASTY:  Balloon aortic valvuloplasty was performed using a 23 mm valvuloplasty balloon.  Once optimal position was achieved, BAV was done under rapid ventricular pacing. The  patient recovered well hemodynamically.   TRANSCATHETER HEART VALVE DEPLOYMENT:  An Edwards Sapien 3 transcatheter heart valve (size 26 mm, model #9600TFX, serial #[id number]) was prepared and crimped per manufacturer's guidelines, and the proper orientation of the valve is confirmed on the [employer] delivery system. The valve was advanced through the introducer sheath using normal technique until in an appropriate position in the abdominal aorta beyond the sheath tip. The balloon was then retracted and using the fine-tuning wheel was centered on the valve. The valve was then advanced across the aortic arch using appropriate flexion of the catheter. The valve was carefully positioned across the aortic valve annulus. The Commander catheter was retracted using normal technique. Once final position of the valve has been confirmed by angiographic assessment, the valve is deployed while temporarily holding ventilation and during rapid ventricular pacing to maintain systolic blood pressure < 50 mmHg and pulse pressure < 10 mmHg. The balloon inflation is held for >3 seconds after reaching full deployment volume. Once the balloon has fully deflated the balloon is retracted into the ascending aorta and valve function is assessed using echocardiography. There is felt to be mild-moderate paravalvular leak and no central aortic insufficiency.  After careful review, we elected to post-dilate the valve with an additional cc of contrast. The balloon is advanced again into the stent frame and a second balloon inflation is performed under rapid pacing. The patient's hemodynamic recovery following valve deployment is good.  The deployment balloon and guidewire are both removed. Echo demostrated acceptable post-procedural gradients, stable mitral valve function, and mild-moderate aortic insufficiency. Aortography demonstrated no greater than mild aortic insufficiency.    PROCEDURE COMPLETION:  The sheath was removed and  femoral artery closure is performed using the 2 previously deployed Perclose devices.  Protamine is administered once femoral arterial repair was complete. The site is clear with no  evidence of bleeding or hematoma after the sutures are tightened. The temporary pacemaker, pigtail catheters and femoral sheaths were removed with manual pressure used for hemostasis.   The patient tolerated the procedure well and is transported to the surgical intensive care in stable condition. There were no immediate intraoperative complications. All sponge instrument and needle counts are verified correct at completion of the operation.   The patient received a total of 81 mL of intravenous contrast during the procedure.   Sherren Mocha, MD 02/01/2018 11:12 AM

## 2018-02-01 NOTE — Progress Notes (Signed)
Site area: rt groin fa and fv sheaths pulled and pressure held by [employer] Site Prior to Removal:  Level 0 Pressure Applied For: 20 minutes Manual:   yes Patient Status During Pull:  stable Post Pull Site:  Level 0 Post Pull Instructions Given:  yes Post Pull Pulses Present: rt dp palpable Dressing Applied:  Gauze and tegaderm Bedrest begins @ 1030 Comments:

## 2018-02-01 NOTE — Progress Notes (Addendum)
  [place] VALVE TEAM  Patient doing well s/p TAVR. He is hemodynamically stable. Groin sites stable. ECG with new LBBB and1st degree AV block, HR in 30s. Continue to monitor closely. Arterial line removed and transferred to 4E. Plan for early ambulation and hopeful discharge over next 24-48 hours if no conduction issues.   Angelena Form PA-C  [facility]  Pager [phone]

## 2018-02-01 NOTE — Anesthesia Procedure Notes (Signed)
Arterial Line Insertion Start/End10/05/2017 7:00 AM, 02/01/2018 7:03 AM Performed by: Inda Coke, CRNA, CRNA  Preanesthetic checklist: patient identified, IV checked, site marked, risks and benefits discussed, surgical consent, monitors and equipment checked, pre-op evaluation, timeout performed and anesthesia consent Lidocaine 1% used for infiltration and patient sedated Right, radial was placed Catheter size: 20 G Hand hygiene performed  and maximum sterile barriers used  Allen's test indicative of satisfactory collateral circulation Attempts: 1 Procedure performed without using ultrasound guided technique. Ultrasound Notes:anatomy identified Following insertion, dressing applied and Biopatch. Post procedure assessment: normal  Patient tolerated the procedure well with no immediate complications.

## 2018-02-01 NOTE — Progress Notes (Signed)
  Echocardiogram 2D Echocardiogram Limited has been performed.  Jared Norton M 02/01/2018, 9:46 AM

## 2018-02-01 NOTE — Anesthesia Procedure Notes (Signed)
Procedure Name: MAC Date/Time: 02/01/2018 7:30 AM Performed by: Inda Coke, CRNA Pre-anesthesia Checklist: Patient identified, Emergency Drugs available, Suction available, Timeout performed and Patient being monitored Patient Re-evaluated:Patient Re-evaluated prior to induction Oxygen Delivery Method: Simple face mask Induction Type: IV induction Dental Injury: Teeth and Oropharynx as per pre-operative assessment

## 2018-02-01 NOTE — Transfer of Care (Signed)
Immediate Anesthesia Transfer of Care Note  Patient: Jared Norton.  Procedure(s) Performed: TRANSCATHETER AORTIC VALVE REPLACEMENT, TRANSFEMORAL. 4m EDWARDS SAPIEN 3 THV. (N/A Chest) INTRAOPERATIVE TRANSTHORACIC ECHOCARDIOGRAM (N/A Chest)  Patient Location: PACU and Cath Lab  Anesthesia Type:MAC  Level of Consciousness: awake, alert  and oriented  Airway & Oxygen Therapy: Patient Spontanous Breathing and Patient connected to nasal cannula oxygen  Post-op Assessment: Report given to RN, Post -op Vital signs reviewed and stable and Patient moving all extremities X 4  Post vital signs: Reviewed and stable  Last Vitals:  Vitals Value Taken Time  BP 112/53 02/01/2018 10:03 AM  Temp 36.3 C 02/01/2018  9:59 AM  Pulse 38 02/01/2018 10:07 AM  Resp 16 02/01/2018 10:07 AM  SpO2 100 % 02/01/2018 10:07 AM  Vitals shown include unvalidated device data.  Last Pain:  Vitals:   02/01/18 0959  TempSrc: Temporal  PainSc: 0-No pain      Patients Stated Pain Goal: 2 ([id number]  Complications: No apparent anesthesia complications

## 2018-02-01 NOTE — Progress Notes (Signed)
Pt ambulated in hallway 559ft. Tolerated well. Returned to the chair. VSS.  Clyde Canterbury, RN

## 2018-02-01 NOTE — Interval H&P Note (Signed)
History and Physical Interval Note:  02/01/2018 6:08 AM  Jared Norton.  has presented today for surgery, with the diagnosis of Severe Aortic Stenosis  The various methods of treatment have been discussed with the patient and family. After consideration of risks, benefits and other options for treatment, the patient has consented to  Procedure(s): TRANSCATHETER AORTIC VALVE REPLACEMENT, TRANSFEMORAL (N/A) TRANSESOPHAGEAL ECHOCARDIOGRAM (TEE) (N/A) as a surgical intervention .  The patient's history has been reviewed, patient examined, no change in status, stable for surgery.  I have reviewed the patient's chart and labs.  Questions were answered to the patient's satisfaction.     Rexene Alberts

## 2018-02-01 NOTE — Progress Notes (Signed)
Site area: rt radial arterial line removed and pressure held by [employer] Prior to Removal:  Level 0 Pressure Applied For: 7 minutes Manual:   yes Patient Status During Pull:  stable Post Pull Site:  Level 0 Post Pull Instructions Given:  yes Post Pull Pulses Present: rt radial palpable Dressing Applied:  Gauze and tegaderm Bedrest begins @  Comments:

## 2018-02-01 NOTE — Anesthesia Preprocedure Evaluation (Signed)
Anesthesia Evaluation  Patient identified by MRN, date of birth, ID band Patient awake    Reviewed: Allergy & Precautions, H&P , NPO status , Patient's Chart, lab work & pertinent test results  History of Anesthesia Complications (+) PONV and history of anesthetic complications  Airway Mallampati: II   Neck ROM: full    Dental   Pulmonary    breath sounds clear to auscultation       Cardiovascular + Valvular Problems/Murmurs AS  Rhythm:regular Rate:Normal     Neuro/Psych    GI/Hepatic   Endo/Other    Renal/GU      Musculoskeletal   Abdominal   Peds  Hematology   Anesthesia Other Findings   Reproductive/Obstetrics                             Anesthesia Physical Anesthesia Plan  ASA: III  Anesthesia Plan: MAC   Post-op Pain Management:    Induction: Intravenous  PONV Risk Score and Plan: 2 and Ondansetron, Dexamethasone and Treatment may vary due to age or medical condition  Airway Management Planned: Simple Face Mask  Additional Equipment: Arterial line, CVP and Ultrasound Guidance Line Placement  Intra-op Plan:   Post-operative Plan:   Informed Consent: I have reviewed the patients History and Physical, chart, labs and discussed the procedure including the risks, benefits and alternatives for the proposed anesthesia with the patient or authorized representative who has indicated his/her understanding and acceptance.     Plan Discussed with: CRNA, Anesthesiologist and Surgeon  Anesthesia Plan Comments:         Anesthesia Quick Evaluation

## 2018-02-01 NOTE — Op Note (Signed)
[facility]   MULTIDISCIPLINARY HEART VALVE TEAM   TAVR OPERATIVE NOTE   Date of Procedure:  02/01/2018  Preoperative Diagnosis: Severe Aortic Stenosis   Postoperative Diagnosis: Same   Procedure:    Transcatheter Aortic Valve Replacement - Percutaneous Left Transfemoral Approach  Edwards Sapien 3 THV (size 26 mm, model # 9600TFX, serial # [id number])   Co-Surgeons:  Valentina Gu. Roxy Manns, MD and Sherren Mocha, MD  Anesthesiologist:  Albertha Ghee, MD  Echocardiographer:  Sanda Klein, MD  Pre-operative Echo Findings:  Severe aortic stenosis  Normal left ventricular systolic function  Post-operative Echo Findings:  Mild-moderate paravalvular leak  Normal left ventricular systolic function   BRIEF CLINICAL NOTE AND INDICATIONS FOR SURGERY  Patient is a 77 year old male who has been referred for surgical consultation to discuss treatment options for management of severe symptomatic aortic stenosis.  Patient states that he has known of presence of a heart murmur for many years.  He has been remarkably healthy and physically active all of his life, and he has been followed carefully by his primary care physician.  Patient has remained quite active physically, although he admits that over the past year or so he has noticed a mild decrease in his exercise tolerance with mild pressure-like discomfort and exertional shortness of breath with more strenuous exertion.  The patient walks 4 miles every day including walking up and [place] hills in the mountains.  He states that he has developed a tendency to slow down and he has found that he has to "push himself through" to finish his height sometimes.  He otherwise feels fine and he denies any symptoms of shortness of breath or chest discomfort with low-level activity or at rest.  He is never had any palpitations, dizzy spells, nor syncope.  He denies any orthopnea or lower extremity edema.    Recent follow-up echocardiogram  revealed the presence of severe aortic stenosis with preserved left ventricular systolic function.  The patient was subsequently referred to the multidisciplinary heart valve clinic and has been evaluated previously by Dr. Burt Knack.  He underwent diagnostic cardiac catheterization on December 28, 2017 and was found to have mild nonobstructive coronary artery disease with normal right heart pressures.  CT angiography was performed and cardiothoracic surgical consultation requested.  During the course of the patient's preoperative work up they have been evaluated comprehensively by a multidisciplinary team of specialists coordinated through the [facility] in the [place] [facility].  They have been demonstrated to suffer from symptomatic severe aortic stenosis as noted above. The patient has been counseled extensively as to the relative risks and benefits of all options for the treatment of severe aortic stenosis including long term medical therapy, conventional surgery for aortic valve replacement, and transcatheter aortic valve replacement.  All questions have been answered, and the patient provides full informed consent for the operation as described.   DETAILS OF THE OPERATIVE PROCEDURE  PREPARATION:    The patient is brought to the operating room on the above mentioned date and central monitoring was established by the anesthesia team including placement of a central venous line and radial arterial line. The patient is placed in the supine position on the operating table.  Intravenous antibiotics are administered. The patient is monitored closely throughout the procedure under conscious sedation.  Baseline transthoracic echocardiogram was performed. The patient's chest, abdomen, both groins, and both lower extremities are prepared and draped in a sterile manner. A time out procedure is  performed.   PERIPHERAL ACCESS:    Using the modified Seldinger  technique, femoral arterial and venous access was obtained with placement of 6 Fr sheaths on the right side.  A pigtail diagnostic catheter was passed through the right arterial sheath under fluoroscopic guidance into the aortic root.  A temporary transvenous pacemaker catheter was passed through the right femoral venous sheath under fluoroscopic guidance into the right ventricle.  The pacemaker was tested to ensure stable lead placement and pacemaker capture. Aortic root angiography was performed in order to determine the optimal angiographic angle for valve deployment.   TRANSFEMORAL ACCESS:   Percutaneous transfemoral access and sheath placement was performed using ultrasound guidance.  The left common femoral artery was cannulated using a micropuncture needle and appropriate location was verified using hand injection angiogram.  A pair of [employer] Perclose percutaneous closure devices were placed and a 6 French sheath replaced into the femoral artery.  The patient was heparinized systemically and ACT verified > 250 seconds.    A 14 Fr transfemoral E-sheath was introduced into the left common femoral artery after progressively dilating over an Amplatz superstiff wire. An AL-2 catheter was used to direct a straight-tip exchange length wire across the native aortic valve into the left ventricle.  This took several attempts because of the severity of the patient's aortic stenosis but ultimately the valve was crossed uneventfully.  The wire was exchanged out for a pigtail catheter and position was confirmed in the LV apex. Simultaneous LV and Ao pressures were recorded.  The pigtail catheter was exchanged for an Amplatz Extra-stiff wire in the LV apex.  Echocardiography was utilized to confirm appropriate wire position and no sign of entanglement in the mitral subvalvular apparatus.   BALLOON AORTIC VALVULOPLASTY:   Balloon aortic valvuloplasty was performed using a 22 mm valvuloplasty balloon.  Once  optimal position was achieved, BAV was done under rapid ventricular pacing. The patient recovered well hemodynamically.    TRANSCATHETER HEART VALVE DEPLOYMENT:   An Edwards Sapien 3 transcatheter heart valve (size 26 mm, model #9600TFX, serial #[id number]) was prepared and crimped per manufacturer's guidelines, and the proper orientation of the valve is confirmed on the [employer] delivery system. The valve was advanced through the introducer sheath using normal technique until in an appropriate position in the abdominal aorta beyond the sheath tip. The balloon was then retracted and using the fine-tuning wheel was centered on the valve. The valve was then advanced across the aortic arch using appropriate flexion of the catheter. The valve was carefully positioned across the aortic valve annulus. The Commander catheter was retracted using normal technique. Once final position of the valve has been confirmed by angiographic assessment, the valve is deployed while temporarily holding ventilation and during rapid ventricular pacing to maintain systolic blood pressure < 50 mmHg and pulse pressure < 10 mmHg. The balloon inflation is held for >3 seconds after reaching full deployment volume. Once the balloon has fully deflated the balloon is retracted into the ascending aorta and valve function is assessed using echocardiography. There is felt to be mild-moderate paravalvular leak and no central aortic insufficiency.  The patient's hemodynamic recovery following valve deployment is good.  Subsequently 1 mL of saline was added to the atrial and system in the valve was postdilated under rapid pacing.  The patient tolerated postdilatation well with rapid hemodynamic recovery.  Repeat echocardiogram revealed some improvement in the paravalvular leak.  The deployment balloon and guidewire are both removed.    PROCEDURE  COMPLETION:   The sheath was removed and femoral artery closure performed.  Protamine was  administered once femoral arterial repair was complete. The temporary pacemaker, pigtail catheters and femoral sheaths were removed with manual pressure used for hemostasis.   The patient tolerated the procedure well and is transported to the surgical intensive care in stable condition. There were no immediate intraoperative complications. All sponge instrument and needle counts are verified correct at completion of the operation.   No blood products were administered during the operation.  The patient received a total of 80.6 mL of intravenous contrast during the procedure.   Rexene Alberts, MD 02/01/2018 10:04 AM

## 2018-02-01 NOTE — Progress Notes (Signed)
Patient interviewed in the preop area. Patient able to confirm name, DOB, procedure, allergies, no metal in body, npo status and no pain.  Leatha Gilding, RN

## 2018-02-01 NOTE — Anesthesia Procedure Notes (Signed)
Central Venous Catheter Insertion Performed by: Duane Boston, MD, anesthesiologist Start/End10/05/2017 6:47 AM, 02/01/2018 6:57 AM Patient location: Pre-op. Preanesthetic checklist: patient identified, IV checked, site marked, risks and benefits discussed, surgical consent, monitors and equipment checked, pre-op evaluation, timeout performed and anesthesia consent Position: Trendelenburg Lidocaine 1% used for infiltration and patient sedated Hand hygiene performed , maximum sterile barriers used  and Seldinger technique used Catheter size: 8 Fr Total catheter length 16. Central line was placed.Double lumen Procedure performed using ultrasound guided technique. Ultrasound Notes:image(s) printed for medical record Attempts: 1 Following insertion, dressing applied, line sutured and Biopatch. Post procedure assessment: blood return through all ports, free fluid flow and no air  Patient tolerated the procedure well with no immediate complications.

## 2018-02-01 NOTE — Progress Notes (Signed)
Pt received from cath lab. VSS. HR 30-40 SB. Telemetry applied. CHG complete. Bilateral groins level 0. Pt and wife oriented to room and unit. Will continue to monitor.  Clyde Canterbury, RN

## 2018-02-02 ENCOUNTER — Other Ambulatory Visit: Payer: Self-pay | Admitting: Physician Assistant

## 2018-02-02 ENCOUNTER — Inpatient Hospital Stay (HOSPITAL_COMMUNITY): Payer: Medicare Other

## 2018-02-02 ENCOUNTER — Encounter (HOSPITAL_COMMUNITY): Payer: Self-pay | Admitting: Cardiovascular Disease

## 2018-02-02 ENCOUNTER — Ambulatory Visit (INDEPENDENT_AMBULATORY_CARE_PROVIDER_SITE_OTHER): Payer: Medicare Other

## 2018-02-02 DIAGNOSIS — R001 Bradycardia, unspecified: Secondary | ICD-10-CM

## 2018-02-02 DIAGNOSIS — I340 Nonrheumatic mitral (valve) insufficiency: Secondary | ICD-10-CM

## 2018-02-02 DIAGNOSIS — Z952 Presence of prosthetic heart valve: Secondary | ICD-10-CM

## 2018-02-02 DIAGNOSIS — I454 Nonspecific intraventricular block: Secondary | ICD-10-CM

## 2018-02-02 LAB — CBC
HEMATOCRIT: 38.2 % — AB (ref 39.0–52.0)
Hemoglobin: 12.8 g/dL — ABNORMAL LOW (ref 13.0–17.0)
MCH: 34.6 pg — AB (ref 26.0–34.0)
MCHC: 33.5 g/dL (ref 30.0–36.0)
MCV: 103.2 fL — AB (ref 78.0–100.0)
Platelets: 100 10*3/uL — ABNORMAL LOW (ref 150–400)
RBC: 3.7 MIL/uL — ABNORMAL LOW (ref 4.22–5.81)
RDW: 12.7 % (ref 11.5–15.5)
WBC: 8.8 10*3/uL (ref 4.0–10.5)

## 2018-02-02 LAB — BASIC METABOLIC PANEL
Anion gap: 3 — ABNORMAL LOW (ref 5–15)
BUN: 14 mg/dL (ref 8–23)
CALCIUM: 8.1 mg/dL — AB (ref 8.9–10.3)
CO2: 25 mmol/L (ref 22–32)
CREATININE: 0.92 mg/dL (ref 0.61–1.24)
Chloride: 107 mmol/L (ref 98–111)
GFR calc Af Amer: >60 mL/min (ref >60)
GFR calc non Af Amer: >60 mL/min (ref >60)
GLUCOSE: 100 mg/dL — AB (ref 70–99)
Potassium: 3.7 mmol/L (ref 3.5–5.1)
Sodium: 135 mmol/L (ref 135–145)

## 2018-02-02 LAB — ECHOCARDIOGRAM COMPLETE
Height: 66 in
WEIGHTICAEL: 2275.2 [oz_av]

## 2018-02-02 LAB — MAGNESIUM: Magnesium: 2.1 mg/dL (ref 1.7–2.4)

## 2018-02-02 MED ORDER — ASPIRIN EC 81 MG PO TBEC: 81.0000 mg | DELAYED_RELEASE_TABLET | Freq: Every day | ORAL | Status: AC

## 2018-02-02 MED ORDER — CLOPIDOGREL BISULFATE 75 MG PO TABS: 75.0000 mg | ORAL_TABLET | Freq: Every day | ORAL | 1 refills | Status: AC

## 2018-02-02 NOTE — Discharge Summary (Addendum)
[facility] TEAM  Discharge Summary    Patient ID: Jared Norton. MRN: [id number]; DOB: 1940-06-30  Admit date: 02/01/2018 Discharge date: 02/02/2018  Primary Care Provider: Marton Redwood, MD  Primary Cardiologist: Dr. Burt Knack   Discharge Diagnoses    Principal Problem:   S/P TAVR (transcatheter aortic valve replacement) Active Problems:   Severe aortic stenosis   Sinus bradycardia   Bundle branch block   Allergies Allergies  Allergen Reactions  . Ether Nausea Only and Other (See Comments)    Ether used as anesthesia when he was a child    Diagnostic Studies/Procedures    TAVR OPERATIVE NOTE   Date of Procedure:                02/01/2018  Preoperative Diagnosis:      Severe Aortic Stenosis  Procedure:        Transcatheter Aortic Valve Replacement - Percutaneous Transfemoral Approach             Edwards Sapien 3 THV (size 26 mm, model # 9600TFX, serial # [id number])              Co-Surgeons:                        Valentina Gu. Roxy Manns, MD and Sherren Mocha, MD Pre-operative Echo Findings: ? Severe aortic stenosis ? Normal left ventricular systolic function  Post-operative Echo Findings: ? Mild-moderate paravalvular leak ? Normal left ventricular systolic function  _____________  Echo 02/02/18 Study Conclusions - Left ventricle: The cavity size was normal. Wall thickness was   increased in a pattern of mild LVH. Systolic function was   vigorous. The estimated ejection fraction was in the range of 65%   to 70%. Wall motion was normal; there were no regional wall   motion abnormalities. Features are consistent with a pseudonormal   left ventricular filling pattern, with concomitant abnormal   relaxation and increased filling pressure (grade 2 diastolic   dysfunction). - Aortic valve: Bioprosthetic aortic valve s/p TAVR. Well-seated   valve, gradient not significantly elevated. There was trivial  regurgitation. Mean gradient (S): 15 mm Hg. Valve area (VTI):   1.55 cm^2. - Mitral valve: Mildly calcified annulus. There was mild   regurgitation. Valve area by pressure half-time: 2.06 cm^2. - Left atrium: The atrium was severely dilated. - Right ventricle: The cavity size was normal. Systolic function   was normal. - Right atrium: The atrium was mildly dilated. - Tricuspid valve: Peak RV-RA gradient (S): 34 mm Hg. - Pulmonary arteries: PA peak pressure: 37 mm Hg (S). - Inferior vena cava: The vessel was normal in size. The   respirophasic diameter changes were in the normal range (>= 50%),   consistent with normal central venous pressure. Impressions: - Normal LV size with EF 96-28%, vigorous systolic function. Mild   LV hypertrophy. Normal RV size and systolic function. Mild MR.   Mild pulmonary hypertension. Bioprosthetic aortic valve s/p TAVR,   valve appears well-seated with trivial aortic insufficiency.    History of Present Illness    Jared Blunck. is a 77 y.o. male with a history of sinus bradycardia and severe aortic who presented to [facility] on [facility]01[facility]18 for planned TAVR.    Patient reported that he has known of presence of a heart murmur for many years.  He has been remarkably healthy and physically active all of his life, and he has been  followed carefully by his primary care physician.  Patient has remained quite active physically, although he admits that over the past year or so he has noticed a mild decrease in his exercise tolerance with mild pressure-like discomfort and exertional shortness of breath with more strenuous exertion.Recent follow-up echocardiogram revealed the presence of severe aortic stenosis with preserved left ventricular systolic function. The patient was subsequently referred to the multidisciplinary heart valve clinic and has been evaluated previously by Dr. Burt Knack. He underwent diagnostic cardiac catheterization on December 28, 2017 and was found to  have mild nonobstructive coronary artery disease with normal right heart pressures.   He was evaluated by the multidisciplinary heart valve team and felt to be a suitable candidate for TAVR which was set up for 02/01/2018.  Hospital Course     Consultants: None  Severe AS: s/p successful TAVR with a 26 mm Edwards Sapien 3 THV via the TF approach on 02/01/2018. Post operative echo showed EF 65%, normally functioning TAVR with mean gradient 15 mm hg and trivial regurgitation. Groin sites are stable.  Will be discharged on aspirin 81 mg and Plavix 75 mg daily.  Postoperative conduction disturbance: the patient has a long history of sinus bradycardia. HR was initially slow in the 30s post operatively, but now back in high 40s and 50s. He initially had a new LBBB with 1st degree AV block on post operative ECG.  Today tracing shows a RBBB but PR interval has normalized. Dr. Curt Bears has reviewed ECG tracings and agrees that a 30-day monitor is indicated.  I have set this up for 3 PM today.  _____________  Discharge Vitals Blood pressure 129/68, pulse (!) 47, temperature 98.4 F (36.9 C), temperature source Oral, resp. rate 16, height 5\' 6"  (1.676 m), weight 64.5 kg, SpO2 99 %.  Filed Weights   02/01/18 0613 02/02/18 0509  Weight: 63.2 kg 64.5 kg   VS:  BP 129/68 (BP Location: Left Arm)   Pulse (!) 47   Temp 98.4 F (36.9 C) (Oral)   Resp 16   Ht 5\' 6"  (1.676 m)   Wt 64.5 kg   SpO2 99%   BMI 22.95 kg/m    GEN: Well nourished, well developed, in no acute distress  HEENT: normal  Neck: no JVD, carotid bruits, or masses Cardiac: brady; 2/6 SEM, no rubs, or gallops,no edema  Respiratory:  clear to auscultation bilaterally, normal work of breathing GI: soft, nontender, nondistended, + BS MS: no deformity or atrophy  Skin: warm and dry, no rash Neuro:  Alert and Oriented x 3, Strength and sensation are intact Psych: euthymic mood, full affect    Labs & Radiologic Studies    CBC Recent  Labs    02/01/18 1004 02/02/18 0510  WBC  --  8.8  HGB 11.9* 12.8*  HCT 35.0* 38.2*  MCV  --  103.2*  PLT  --  237*   Basic Metabolic Panel Recent Labs    02/01/18 1004 02/02/18 0510  NA 139 135  K 4.2 3.7  CL 105 107  CO2  --  25  GLUCOSE 137* 100*  BUN 14 14  CREATININE 0.80 0.92  CALCIUM  --  8.1*  MG  --  2.1   Liver Function Tests No results for input(s): AST, ALT, ALKPHOS, BILITOT, PROT, ALBUMIN in the last 72 hours. No results for input(s): LIPASE, AMYLASE in the last 72 hours. Cardiac Enzymes No results for input(s): CKTOTAL, CKMB, CKMBINDEX, TROPONINI in the last 72 hours. BNP  Invalid input(s): POCBNP D-Dimer No results for input(s): DDIMER in the last 72 hours. Hemoglobin A1C No results for input(s): HGBA1C in the last 72 hours. Fasting Lipid Panel No results for input(s): CHOL, HDL, LDLCALC, TRIG, CHOLHDL, LDLDIRECT in the last 72 hours. Thyroid Function Tests No results for input(s): TSH, T4TOTAL, T3FREE, THYROIDAB in the last 72 hours.  Invalid input(s): FREET3 _____________  Dg Chest 2 View  Result Date: 01/28/2018 CLINICAL DATA:  Shortness of breath. Preop respiratory exam for TAVR EXAM: CHEST - 2 VIEW COMPARISON:  Chest CT 12/30/2017 FINDINGS: Heart is upper limits normal in size. Lungs clear. No effusions. No acute bony abnormality. IMPRESSION: No active cardiopulmonary disease. Electronically Signed   By: Rolm Baptise M.D.   On: 01/28/2018 10:47   Dg Chest Port 1 View  Result Date: 02/01/2018 CLINICAL DATA:  Status post transcatheter aortic valve replacement today. EXAM: PORTABLE CHEST 1 VIEW COMPARISON:  PA and lateral chest 01/28/2018. FINDINGS: Right IJ central venous catheter is in place with the tip in the mid to lower superior vena cava. No pneumothorax. Lungs are clear. There is cardiomegaly. New prosthetic aortic valve is in place. Aortic atherosclerosis is noted. No pneumothorax or pleural effusion. IMPRESSION: Right IJ catheter projects in  good position.  No pneumothorax. Cardiomegaly without acute disease. Status post TAVR. Electronically Signed   By: Inge Rise M.D.   On: 02/01/2018 11:55   Disposition   Pt is being discharged home today in good condition.  Follow-up Plans & Appointments    Follow-up Information    Eileen Stanford, PA-C. Go on 02/14/2018.   Specialties:  Cardiology, Radiology Why:  @ 1:30pm, please arrive at least 10 minutes early Contact information: [place] [address] [phone]          Discharge Instructions    Amb Referral to Cardiac Rehabilitation   Complete by:  As directed    Diagnosis:  Valve Replacement   Valve:  Aortic      Discharge Medications   Allergies as of 02/02/2018      Reactions   Ether Nausea Only, Other (See Comments)   Ether used as anesthesia when he was a child      Medication List    TAKE these medications   aspirin EC 81 MG tablet Take 1 tablet (81 mg total) by mouth daily.   clopidogrel 75 MG tablet Commonly known as:  PLAVIX Take 1 tablet (75 mg total) by mouth daily with breakfast. Start taking on:  02/07/2018   CoQ10 100 MG Caps Take 100 mg by mouth daily.   hydrocortisone 2.5 % cream Apply 1 application topically 2 (two) times daily as needed (for itching).   metroNIDAZOLE 0.75 % gel Commonly known as:  METROGEL Apply 1 application topically 2 (two) times daily as needed (for rosacea).   multivitamin with minerals tablet Take 1 tablet by mouth daily.   OMEGA 3 PO Take 1 capsule by mouth daily.            Outstanding Labs/Studies   Monitor   Duration of Discharge Encounter   Greater than 30 minutes including physician time.  Signed, Angelena Form, PA-C 02/02/2018, 12:03 PM    Patient seen, examined. Available data reviewed. Agree with findings, assessment, and plan as outlined by Nell Range, PA-C. Exam reveals alert, oriented male in NAD. Lungs CTA, heart RRR with 2/6  SEM at the LLSB, abdomen soft, NT, ext: BL groin sites clear, no edema. Echo  shows normal valve function and trial paravalvular regurgitation. Rhythm issues noted but normal PR interval and no AV block noted overnight. I agree he is ok for discharge with an event monitor.   Sherren Mocha, M.D. 02/02/2018 5:21 PM

## 2018-02-02 NOTE — Progress Notes (Signed)
Discharge information reviewed with patient and wife and they deny questions. Patient has all belongings and is taken via wheelchair to family vehicle in stable condition.

## 2018-02-02 NOTE — Anesthesia Postprocedure Evaluation (Signed)
Anesthesia Post Note  Patient: Jared Norton.  Procedure(s) Performed: TRANSCATHETER AORTIC VALVE REPLACEMENT, TRANSFEMORAL. 71m EDWARDS SAPIEN 3 THV. (N/A Chest) INTRAOPERATIVE TRANSTHORACIC ECHOCARDIOGRAM (N/A Chest)     Patient location during evaluation: ICU Anesthesia Type: MAC Level of consciousness: awake and alert Pain management: pain level controlled Vital Signs Assessment: post-procedure vital signs reviewed and stable Respiratory status: spontaneous breathing, nonlabored ventilation, respiratory function stable and patient connected to nasal cannula oxygen Cardiovascular status: stable and blood pressure returned to baseline Postop Assessment: no apparent nausea or vomiting Anesthetic complications: no    Last Vitals:  Vitals:   02/02/18 0509 02/02/18 0757  BP: (!) 143/79 129/68  Pulse: (!) 57 (!) 47  Resp: 13 16  Temp: 36.9 C   SpO2: 96% 99%    Last Pain:  Vitals:   02/02/18 0509  TempSrc: Oral  PainSc:                  [place]

## 2018-02-02 NOTE — Progress Notes (Signed)
  Echocardiogram 2D Echocardiogram has been performed.  Jared Norton 02/02/2018, 9:44 AM

## 2018-02-02 NOTE — Progress Notes (Signed)
CARDIAC REHAB PHASE I   Pt ambulating independently in hallways multiple times this am. Pt states he feels "free and open". Pt given temporary valve replacement card. Reviewed restrictions and exercise guidelines. Will refer to CRP II [place]. Pt is very active at home. Encouraged pt to take it easy and listen to his body. Pt hopeful for d/c today.  [id number] Rufina Falco, RN BSN 02/02/2018 9:05 AM

## 2018-02-02 NOTE — Progress Notes (Signed)
R IJ double lumen pulled per protocol with tip intact and Jared Norton tolerated well. He is on bedrest until 1120 and wife at bedside and both verbalize understanding.

## 2018-02-02 NOTE — Progress Notes (Signed)
Pt up this AM walking halls multiple times independently. Tolerating well on RA.

## 2018-02-03 ENCOUNTER — Encounter (HOSPITAL_COMMUNITY): Admission: EM | Disposition: E | Payer: Self-pay | Source: Home / Self Care | Attending: Neurology

## 2018-02-03 ENCOUNTER — Inpatient Hospital Stay (HOSPITAL_COMMUNITY): Payer: Medicare Other | Admitting: Certified Registered"

## 2018-02-03 ENCOUNTER — Telehealth: Payer: Self-pay

## 2018-02-03 ENCOUNTER — Encounter (HOSPITAL_COMMUNITY): Payer: Self-pay | Admitting: Anesthesiology

## 2018-02-03 ENCOUNTER — Inpatient Hospital Stay (HOSPITAL_COMMUNITY)
Admission: EM | Admit: 2018-02-03 | Discharge: 2018-03-04 | DRG: 981 | Disposition: E | Payer: Medicare Other | Attending: Neurology | Admitting: Neurology

## 2018-02-03 ENCOUNTER — Emergency Department (HOSPITAL_COMMUNITY): Payer: Medicare Other

## 2018-02-03 ENCOUNTER — Telehealth (HOSPITAL_COMMUNITY): Payer: Self-pay

## 2018-02-03 ENCOUNTER — Inpatient Hospital Stay (HOSPITAL_COMMUNITY): Payer: Medicare Other | Admitting: Anesthesiology

## 2018-02-03 ENCOUNTER — Inpatient Hospital Stay (HOSPITAL_COMMUNITY): Payer: Medicare Other

## 2018-02-03 ENCOUNTER — Telehealth: Payer: Self-pay | Admitting: Cardiovascular Disease

## 2018-02-03 DIAGNOSIS — Z66 Do not resuscitate: Secondary | ICD-10-CM | POA: Diagnosis present

## 2018-02-03 DIAGNOSIS — Z79899 Other long term (current) drug therapy: Secondary | ICD-10-CM

## 2018-02-03 DIAGNOSIS — E872 Acidosis: Secondary | ICD-10-CM | POA: Diagnosis present

## 2018-02-03 DIAGNOSIS — I97638 Postprocedural hematoma of a circulatory system organ or structure following other circulatory system procedure: Secondary | ICD-10-CM | POA: Diagnosis not present

## 2018-02-03 DIAGNOSIS — I442 Atrioventricular block, complete: Secondary | ICD-10-CM | POA: Diagnosis present

## 2018-02-03 DIAGNOSIS — Z888 Allergy status to other drugs, medicaments and biological substances status: Secondary | ICD-10-CM | POA: Diagnosis not present

## 2018-02-03 DIAGNOSIS — D62 Acute posthemorrhagic anemia: Secondary | ICD-10-CM | POA: Diagnosis present

## 2018-02-03 DIAGNOSIS — I10 Essential (primary) hypertension: Secondary | ICD-10-CM | POA: Diagnosis present

## 2018-02-03 DIAGNOSIS — Z953 Presence of xenogenic heart valve: Secondary | ICD-10-CM | POA: Diagnosis not present

## 2018-02-03 DIAGNOSIS — R571 Hypovolemic shock: Secondary | ICD-10-CM | POA: Diagnosis present

## 2018-02-03 DIAGNOSIS — J9601 Acute respiratory failure with hypoxia: Secondary | ICD-10-CM | POA: Diagnosis not present

## 2018-02-03 DIAGNOSIS — J9690 Respiratory failure, unspecified, unspecified whether with hypoxia or hypercapnia: Secondary | ICD-10-CM | POA: Diagnosis not present

## 2018-02-03 DIAGNOSIS — R29707 NIHSS score 7: Secondary | ICD-10-CM | POA: Diagnosis present

## 2018-02-03 DIAGNOSIS — R29810 Facial weakness: Secondary | ICD-10-CM | POA: Diagnosis present

## 2018-02-03 DIAGNOSIS — R58 Hemorrhage, not elsewhere classified: Secondary | ICD-10-CM | POA: Diagnosis not present

## 2018-02-03 DIAGNOSIS — Z8673 Personal history of transient ischemic attack (TIA), and cerebral infarction without residual deficits: Secondary | ICD-10-CM | POA: Diagnosis not present

## 2018-02-03 DIAGNOSIS — R578 Other shock: Secondary | ICD-10-CM | POA: Diagnosis present

## 2018-02-03 DIAGNOSIS — K661 Hemoperitoneum: Secondary | ICD-10-CM | POA: Diagnosis not present

## 2018-02-03 DIAGNOSIS — I639 Cerebral infarction, unspecified: Secondary | ICD-10-CM | POA: Diagnosis present

## 2018-02-03 DIAGNOSIS — Z9289 Personal history of other medical treatment: Secondary | ICD-10-CM

## 2018-02-03 DIAGNOSIS — I6602 Occlusion and stenosis of left middle cerebral artery: Secondary | ICD-10-CM | POA: Diagnosis present

## 2018-02-03 DIAGNOSIS — I350 Nonrheumatic aortic (valve) stenosis: Secondary | ICD-10-CM | POA: Diagnosis present

## 2018-02-03 DIAGNOSIS — Z7902 Long term (current) use of antithrombotics/antiplatelets: Secondary | ICD-10-CM | POA: Diagnosis not present

## 2018-02-03 DIAGNOSIS — R001 Bradycardia, unspecified: Secondary | ICD-10-CM | POA: Diagnosis present

## 2018-02-03 DIAGNOSIS — Z7982 Long term (current) use of aspirin: Secondary | ICD-10-CM

## 2018-02-03 DIAGNOSIS — R4701 Aphasia: Secondary | ICD-10-CM | POA: Diagnosis present

## 2018-02-03 HISTORY — PX: TEMPORARY PACEMAKER: CATH118268

## 2018-02-03 HISTORY — PX: IR ANGIO INTRA EXTRACRAN SEL COM CAROTID INNOMINATE UNI L MOD SED: IMG5358

## 2018-02-03 HISTORY — PX: FEMORAL ARTERY EXPLORATION: SHX5160

## 2018-02-03 HISTORY — PX: RADIOLOGY WITH ANESTHESIA: SHX6223

## 2018-02-03 LAB — MAGNESIUM: Magnesium: 1.7 mg/dL (ref 1.7–2.4)

## 2018-02-03 LAB — I-STAT CHEM 8, ED
BUN: 16 mg/dL (ref 8–23)
CALCIUM ION: 1.02 mmol/L — AB (ref 1.15–1.40)
CREATININE: 0.9 mg/dL (ref 0.61–1.24)
Chloride: 101 mmol/L (ref 98–111)
GLUCOSE: 100 mg/dL — AB (ref 70–99)
HCT: 37 % — ABNORMAL LOW (ref 39.0–52.0)
HEMOGLOBIN: 12.6 g/dL — AB (ref 13.0–17.0)
Potassium: 3.5 mmol/L (ref 3.5–5.1)
Sodium: 136 mmol/L (ref 135–145)
TCO2: 22 mmol/L (ref 22–32)

## 2018-02-03 LAB — POCT I-STAT 3, ART BLOOD GAS (G3+)
ACID-BASE DEFICIT: 22 mmol/L — AB (ref 0.0–2.0)
BICARBONATE: 6.9 mmol/L — AB (ref 20.0–28.0)
O2 Saturation: 99 %
PO2 ART: 186 mmHg — AB (ref 83.0–108.0)
TCO2: 8 mmol/L — AB (ref 22–32)
pCO2 arterial: 25.1 mmHg — ABNORMAL LOW (ref 32.0–48.0)
pH, Arterial: 7.047 — CL (ref 7.350–7.450)

## 2018-02-03 LAB — CBC
HCT: 37 % — ABNORMAL LOW (ref 39.0–52.0)
Hemoglobin: 12.7 g/dL — ABNORMAL LOW (ref 13.0–17.0)
MCH: 34 pg (ref 26.0–34.0)
MCHC: 34.3 g/dL (ref 30.0–36.0)
MCV: 98.9 fL (ref 78.0–100.0)
PLATELETS: 107 10*3/uL — AB (ref 150–400)
RBC: 3.74 MIL/uL — AB (ref 4.22–5.81)
RDW: 12.4 % (ref 11.5–15.5)
WBC: 9.7 10*3/uL (ref 4.0–10.5)

## 2018-02-03 LAB — APTT: aPTT: 28 seconds (ref 24–36)

## 2018-02-03 LAB — PREPARE RBC (CROSSMATCH)

## 2018-02-03 LAB — COMPREHENSIVE METABOLIC PANEL
ALT: 25 U/L (ref 0–44)
AST: 39 U/L (ref 15–41)
Albumin: 4 g/dL (ref 3.5–5.0)
Alkaline Phosphatase: 35 U/L — ABNORMAL LOW (ref 38–126)
Anion gap: 14 (ref 5–15)
BILIRUBIN TOTAL: 1.1 mg/dL (ref 0.3–1.2)
BUN: 15 mg/dL (ref 8–23)
CALCIUM: 9.1 mg/dL (ref 8.9–10.3)
CO2: 21 mmol/L — ABNORMAL LOW (ref 22–32)
CREATININE: 0.95 mg/dL (ref 0.61–1.24)
Chloride: 101 mmol/L (ref 98–111)
Glucose, Bld: 101 mg/dL — ABNORMAL HIGH (ref 70–99)
Potassium: 3.5 mmol/L (ref 3.5–5.1)
Sodium: 136 mmol/L (ref 135–145)
TOTAL PROTEIN: 6.4 g/dL — AB (ref 6.5–8.1)

## 2018-02-03 LAB — DIFFERENTIAL
ABS IMMATURE GRANULOCYTES: 0 10*3/uL (ref 0.0–0.1)
BASOS PCT: 0 %
Basophils Absolute: 0 10*3/uL (ref 0.0–0.1)
EOS PCT: 1 %
Eosinophils Absolute: 0.1 10*3/uL (ref 0.0–0.7)
IMMATURE GRANULOCYTES: 0 %
Lymphocytes Relative: 21 %
Lymphs Abs: 2 10*3/uL (ref 0.7–4.0)
MONO ABS: 1.3 10*3/uL — AB (ref 0.1–1.0)
MONOS PCT: 14 %
NEUTROS PCT: 64 %
Neutro Abs: 6.2 10*3/uL (ref 1.7–7.7)

## 2018-02-03 LAB — I-STAT TROPONIN, ED: TROPONIN I, POC: 0.6 ng/mL — AB (ref 0.00–0.08)

## 2018-02-03 LAB — POCT I-STAT 4, (NA,K, GLUC, HGB,HCT)
Glucose, Bld: 292 mg/dL — ABNORMAL HIGH (ref 70–99)
HCT: 28 % — ABNORMAL LOW (ref 39.0–52.0)
Hemoglobin: 9.5 g/dL — ABNORMAL LOW (ref 13.0–17.0)
POTASSIUM: 4.2 mmol/L (ref 3.5–5.1)
SODIUM: 139 mmol/L (ref 135–145)

## 2018-02-03 LAB — TYPE AND SCREEN
ABO/RH(D): O POS
ANTIBODY SCREEN: NEGATIVE

## 2018-02-03 LAB — TROPONIN I: Troponin I: 4.73 ng/mL (ref <0.03)

## 2018-02-03 LAB — PROTIME-INR
INR: 1.04
PROTHROMBIN TIME: 13.5 s (ref 11.4–15.2)

## 2018-02-03 LAB — D-DIMER, QUANTITATIVE: D-Dimer, Quant: >20 ug/mL-FEU — ABNORMAL HIGH (ref 0.00–0.50)

## 2018-02-03 LAB — LACTIC ACID, PLASMA: Lactic Acid, Venous: 7.7 mmol/L (ref 0.5–1.9)

## 2018-02-03 LAB — PHOSPHORUS: PHOSPHORUS: 6.8 mg/dL — AB (ref 2.5–4.6)

## 2018-02-03 LAB — FIBRINOGEN: Fibrinogen: <60 mg/dL — CL (ref 210–475)

## 2018-02-03 SURGERY — EXPLORATION, ARTERY, FEMORAL
Anesthesia: General | Laterality: Right

## 2018-02-03 SURGERY — TEMPORARY PACEMAKER
Anesthesia: LOCAL

## 2018-02-03 SURGERY — IR WITH ANESTHESIA
Anesthesia: General

## 2018-02-03 MED ORDER — ACETAMINOPHEN 160 MG/5ML PO SOLN: 650.0000 mg | ORAL | Status: DC | PRN

## 2018-02-03 MED ORDER — FENTANYL CITRATE (PF) 100 MCG/2ML IJ SOLN
INTRAMUSCULAR | Status: AC
  Filled 2018-02-03: qty 2

## 2018-02-03 MED ORDER — ALBUMIN HUMAN 5 % IV SOLN
INTRAVENOUS | Status: DC | PRN
  Administered 2018-02-03 (×2): via INTRAVENOUS

## 2018-02-03 MED ORDER — NICARDIPINE HCL IN NACL 20-0.86 MG/200ML-% IV SOLN: 0.0000 mg/h | INTRAVENOUS | Status: DC

## 2018-02-03 MED ORDER — LIDOCAINE HCL 1 % IJ SOLN
INTRAMUSCULAR | Status: AC
  Filled 2018-02-03: qty 20

## 2018-02-03 MED ORDER — LABETALOL HCL 5 MG/ML IV SOLN: 20.0000 mg | Freq: Once | INTRAVENOUS | Status: DC

## 2018-02-03 MED ORDER — LIDOCAINE HCL (PF) 1 % IJ SOLN
INTRAMUSCULAR | Status: AC
  Filled 2018-02-03: qty 30

## 2018-02-03 MED ORDER — FENTANYL CITRATE (PF) 100 MCG/2ML IJ SOLN
INTRAMUSCULAR | Status: DC | PRN
  Administered 2018-02-03: 25 ug via INTRAVENOUS

## 2018-02-03 MED ORDER — IOPAMIDOL (ISOVUE-370) INJECTION 76%
50.0000 mL | Freq: Once | INTRAVENOUS | Status: AC | PRN
  Administered 2018-02-03: 50 mL via INTRAVENOUS

## 2018-02-03 MED ORDER — LIDOCAINE HCL (PF) 1 % IJ SOLN
INTRAMUSCULAR | Status: DC | PRN
  Administered 2018-02-03: 10 mL

## 2018-02-03 MED ORDER — SODIUM CHLORIDE 0.9% IV SOLUTION: Freq: Once | INTRAVENOUS | Status: DC

## 2018-02-03 MED ORDER — SODIUM BICARBONATE 8.4 % IV SOLN
INTRAVENOUS | Status: AC
  Administered 2018-02-03
  Filled 2018-02-03: qty 150

## 2018-02-03 MED ORDER — MIDAZOLAM HCL 2 MG/2ML IJ SOLN
INTRAMUSCULAR | Status: AC
  Filled 2018-02-03: qty 2

## 2018-02-03 MED ORDER — FENTANYL CITRATE (PF) 100 MCG/2ML IJ SOLN
INTRAMUSCULAR | Status: DC | PRN
  Administered 2018-02-03: 100 ug via INTRAVENOUS

## 2018-02-03 MED ORDER — NOREPINEPHRINE 4 MG/250ML-% IV SOLN
INTRAVENOUS | Status: AC
  Filled 2018-02-03: qty 250

## 2018-02-03 MED ORDER — CALCIUM CHLORIDE 10 % IV SOLN
INTRAVENOUS | Status: DC | PRN
  Administered 2018-02-03 (×2): 1 g via INTRAVENOUS

## 2018-02-03 MED ORDER — SODIUM CHLORIDE 0.9 % IV SOLN
50.0000 mL | Freq: Once | INTRAVENOUS | Status: AC
  Administered 2018-02-03 (×2): 500 mL via INTRAVENOUS

## 2018-02-03 MED ORDER — DEXTROSE 5 % IV SOLN: INTRAVENOUS | Status: DC | PRN

## 2018-02-03 MED ORDER — MIDAZOLAM HCL 2 MG/2ML IJ SOLN
INTRAMUSCULAR | Status: DC | PRN
  Administered 2018-02-03 (×2): 2 mg via INTRAVENOUS

## 2018-02-03 MED ORDER — NOREPINEPHRINE 16 MG/250ML-% IV SOLN
0.0000 ug/min | INTRAVENOUS | Status: DC
  Administered 2018-02-03: 80 ug/min via INTRAVENOUS
  Filled 2018-02-03 (×2): qty 250

## 2018-02-03 MED ORDER — ACETAMINOPHEN 650 MG RE SUPP: 650.0000 mg | RECTAL | Status: DC | PRN

## 2018-02-03 MED ORDER — STROKE: EARLY STAGES OF RECOVERY BOOK
Freq: Once | Status: DC
  Filled 2018-02-03: qty 1

## 2018-02-03 MED ORDER — CEFAZOLIN SODIUM-DEXTROSE 2-3 GM-%(50ML) IV SOLR
INTRAVENOUS | Status: DC | PRN
  Administered 2018-02-03: 2 g via INTRAVENOUS

## 2018-02-03 MED ORDER — SODIUM CHLORIDE 0.9 % IV SOLN
INTRAVENOUS | Status: DC
  Administered 2018-02-03: 14:00:00 via INTRAVENOUS

## 2018-02-03 MED ORDER — EPTIFIBATIDE 20 MG/10ML IV SOLN
INTRAVENOUS | Status: AC
  Filled 2018-02-03: qty 10

## 2018-02-03 MED ORDER — FENTANYL CITRATE (PF) 100 MCG/2ML IJ SOLN: 50.0000 ug | Freq: Once | INTRAMUSCULAR | Status: DC

## 2018-02-03 MED ORDER — ROCURONIUM BROMIDE 10 MG/ML (PF) SYRINGE
PREFILLED_SYRINGE | INTRAVENOUS | Status: DC | PRN
  Administered 2018-02-03: 50 mg via INTRAVENOUS

## 2018-02-03 MED ORDER — ONDANSETRON HCL 4 MG/2ML IJ SOLN
INTRAMUSCULAR | Status: AC
  Administered 2018-02-03: 4 mg
  Filled 2018-02-03: qty 2

## 2018-02-03 MED ORDER — DOPAMINE-DEXTROSE 3.2-5 MG/ML-% IV SOLN
INTRAVENOUS | Status: AC | PRN
  Administered 2018-02-03: 20 ug/kg/min via INTRAVENOUS

## 2018-02-03 MED ORDER — MIDAZOLAM HCL 5 MG/5ML IJ SOLN
INTRAMUSCULAR | Status: DC | PRN
  Administered 2018-02-03: 1 mg via INTRAVENOUS

## 2018-02-03 MED ORDER — ALBUMIN HUMAN 5 % IV SOLN
25.0000 g | Freq: Once | INTRAVENOUS | Status: AC
  Administered 2018-02-03: 25 g via INTRAVENOUS
  Filled 2018-02-03: qty 500

## 2018-02-03 MED ORDER — EPINEPHRINE PF 1 MG/ML IJ SOLN
0.5000 ug/min | INTRAVENOUS | Status: DC
  Administered 2018-02-03: 5 ug/min via INTRAVENOUS
  Filled 2018-02-03: qty 4

## 2018-02-03 MED ORDER — SODIUM CHLORIDE 0.9 % IV BOLUS
1000.0000 mL | Freq: Once | INTRAVENOUS | Status: AC
  Administered 2018-02-03: 1000 mL via INTRAVENOUS

## 2018-02-03 MED ORDER — NOREPINEPHRINE BITARTRATE 1 MG/ML IV SOLN
INTRAVENOUS | Status: AC | PRN
  Administered 2018-02-03: 40 ug/min via INTRAVENOUS

## 2018-02-03 MED ORDER — SODIUM BICARBONATE 8.4 % IV SOLN
INTRAVENOUS | Status: DC
  Administered 2018-02-03: 22:00:00 via INTRAVENOUS
  Filled 2018-02-03 (×2): qty 150

## 2018-02-03 MED ORDER — SODIUM CHLORIDE 0.9 % IV SOLN: INTRAVENOUS | Status: DC

## 2018-02-03 MED ORDER — SENNOSIDES-DOCUSATE SODIUM 8.6-50 MG PO TABS: 1.0000 | ORAL_TABLET | Freq: Every evening | ORAL | Status: DC | PRN

## 2018-02-03 MED ORDER — MIDAZOLAM HCL 2 MG/2ML IJ SOLN: 1.0000 mg | INTRAMUSCULAR | Status: DC | PRN

## 2018-02-03 MED ORDER — SODIUM BICARBONATE 8.4 % IV SOLN
150.0000 meq | Freq: Once | INTRAVENOUS | Status: AC
  Administered 2018-02-03: 150 meq via INTRAVENOUS

## 2018-02-03 MED ORDER — IOHEXOL 300 MG/ML  SOLN
150.0000 mL | Freq: Once | INTRAMUSCULAR | Status: AC | PRN
  Administered 2018-02-03: 50 mL via INTRAVENOUS

## 2018-02-03 MED ORDER — TRANEXAMIC ACID 1000 MG/10ML IV SOLN
1000.0000 mg | INTRAVENOUS | Status: AC
  Administered 2018-02-03: 1000 mg via INTRAVENOUS
  Filled 2018-02-03: qty 10

## 2018-02-03 MED ORDER — FENTANYL 2500MCG IN NS 250ML (10MCG/ML) PREMIX INFUSION
25.0000 ug/h | INTRAVENOUS | Status: DC
  Administered 2018-02-03: 50 ug/h via INTRAVENOUS

## 2018-02-03 MED ORDER — LACTATED RINGERS IV SOLN
INTRAVENOUS | Status: DC | PRN
  Administered 2018-02-03: 19:00:00 via INTRAVENOUS

## 2018-02-03 MED ORDER — 0.9 % SODIUM CHLORIDE (POUR BTL) OPTIME
TOPICAL | Status: DC | PRN
  Administered 2018-02-03: 4000 mL

## 2018-02-03 MED ORDER — SODIUM BICARBONATE 8.4 % IV SOLN
INTRAVENOUS | Status: AC
  Filled 2018-02-03: qty 50

## 2018-02-03 MED ORDER — DOPAMINE-DEXTROSE 3.2-5 MG/ML-% IV SOLN
INTRAVENOUS | Status: DC | PRN
  Administered 2018-02-03: 20 ug/kg/min via INTRAVENOUS

## 2018-02-03 MED ORDER — TRANEXAMIC ACID 1000 MG/10ML IV SOLN
1000.0000 mg | INTRAVENOUS | Status: DC
  Filled 2018-02-03: qty 10

## 2018-02-03 MED ORDER — SODIUM BICARBONATE 8.4 % IV SOLN
INTRAVENOUS | Status: AC
  Administered 2018-02-03: 50 meq
  Filled 2018-02-03: qty 50

## 2018-02-03 MED ORDER — SODIUM CHLORIDE 0.9 % IV SOLN
INTRAVENOUS | Status: DC | PRN
  Administered 2018-02-03: 500 mL

## 2018-02-03 MED ORDER — SODIUM CHLORIDE 0.9 % IV SOLN
INTRAVENOUS | Status: AC
  Filled 2018-02-03: qty 1.2

## 2018-02-03 MED ORDER — LIDOCAINE HCL (PF) 1 % IJ SOLN
INTRAMUSCULAR | Status: DC | PRN
  Administered 2018-02-03: 2 mL

## 2018-02-03 MED ORDER — HEPARIN (PORCINE) IN NACL 1000-0.9 UT/500ML-% IV SOLN
INTRAVENOUS | Status: DC | PRN
  Administered 2018-02-03: 500 mL

## 2018-02-03 MED ORDER — ACETAMINOPHEN 325 MG PO TABS: 650.0000 mg | ORAL_TABLET | ORAL | Status: DC | PRN

## 2018-02-03 MED ORDER — FENTANYL BOLUS VIA INFUSION
25.0000 ug | INTRAVENOUS | Status: DC | PRN
  Filled 2018-02-03: qty 25

## 2018-02-03 MED ORDER — MIDAZOLAM HCL 2 MG/2ML IJ SOLN
INTRAMUSCULAR | Status: DC | PRN
  Administered 2018-02-03: 1 mg via INTRAVENOUS

## 2018-02-03 MED ORDER — TIROFIBAN HCL IN NACL 5-0.9 MG/100ML-% IV SOLN
INTRAVENOUS | Status: AC
  Filled 2018-02-03: qty 100

## 2018-02-03 MED ORDER — TICAGRELOR 90 MG PO TABS
ORAL_TABLET | ORAL | Status: AC
  Filled 2018-02-03: qty 2

## 2018-02-03 MED ORDER — HEPARIN (PORCINE) IN NACL 1000-0.9 UT/500ML-% IV SOLN
INTRAVENOUS | Status: AC
  Filled 2018-02-03: qty 500

## 2018-02-03 MED ORDER — IOPAMIDOL (ISOVUE-300) INJECTION 61%
INTRAVENOUS | Status: AC
  Filled 2018-02-03: qty 50

## 2018-02-03 MED ORDER — NOREPINEPHRINE 4 MG/250ML-% IV SOLN
0.0000 ug/min | INTRAVENOUS | Status: DC
  Administered 2018-02-03: 40 ug/min via INTRAVENOUS
  Filled 2018-02-03 (×2): qty 250

## 2018-02-03 MED ORDER — ASPIRIN 325 MG PO TABS
ORAL_TABLET | ORAL | Status: AC
  Filled 2018-02-03: qty 1

## 2018-02-03 MED ORDER — FAMOTIDINE IN NACL 20-0.9 MG/50ML-% IV SOLN: 20.0000 mg | Freq: Two times a day (BID) | INTRAVENOUS | Status: DC

## 2018-02-03 MED ORDER — SODIUM BICARBONATE 8.4 % IV SOLN
100.0000 meq | Freq: Once | INTRAVENOUS | Status: AC
  Administered 2018-02-03: 100 meq via INTRAVENOUS

## 2018-02-03 MED ORDER — NITROGLYCERIN 1 MG/10 ML FOR IR/CATH LAB
INTRA_ARTERIAL | Status: AC
  Filled 2018-02-03: qty 10

## 2018-02-03 MED ORDER — ALTEPLASE (STROKE) FULL DOSE INFUSION
0.9000 mg/kg | Freq: Once | INTRAVENOUS | Status: AC
  Administered 2018-02-03: 58.5 mg via INTRAVENOUS
  Filled 2018-02-03: qty 100

## 2018-02-03 MED ORDER — NOREPINEPHRINE BITARTRATE 1 MG/ML IV SOLN
INTRAVENOUS | Status: AC | PRN
  Administered 2018-02-03: 10 ug/min via INTRAVENOUS

## 2018-02-03 MED FILL — Medication: Qty: 1 | Status: AC

## 2018-02-03 SURGICAL SUPPLY — 45 items
BANDAGE ACE 4X5 VEL STRL LF (GAUZE/BANDAGES/DRESSINGS) IMPLANT
CANISTER SUCT 3000ML PPV (MISCELLANEOUS) ×2 IMPLANT
CANISTER WOUNDNEG PRESSURE 500 (CANNISTER) ×6 IMPLANT
CLIP VESOCCLUDE MED 24/CT (CLIP) ×2 IMPLANT
CLIP VESOCCLUDE SM WIDE 24/CT (CLIP) ×2 IMPLANT
COVER WAND RF STERILE (DRAPES) ×2 IMPLANT
DERMABOND ADVANCED (GAUZE/BANDAGES/DRESSINGS) ×1
DERMABOND ADVANCED .7 DNX12 (GAUZE/BANDAGES/DRESSINGS) ×1 IMPLANT
DRAIN CHANNEL 15F RND FF W/TCR (WOUND CARE) IMPLANT
DRAPE X-RAY CASS 24X20 (DRAPES) IMPLANT
DRSG VAC ATS SM SENSATRAC (GAUZE/BANDAGES/DRESSINGS) ×2 IMPLANT
ELECT REM PT RETURN 9FT ADLT (ELECTROSURGICAL) ×2
ELECTRODE REM PT RTRN 9FT ADLT (ELECTROSURGICAL) ×1 IMPLANT
EVACUATOR SILICONE 100CC (DRAIN) IMPLANT
GAUZE SPONGE 4X4 12PLY STRL LF (GAUZE/BANDAGES/DRESSINGS) ×2 IMPLANT
GLOVE BIO SURGEON STRL SZ7.5 (GLOVE) ×2 IMPLANT
GOWN STRL REUS W/ TWL LRG LVL3 (GOWN DISPOSABLE) ×2 IMPLANT
GOWN STRL REUS W/ TWL XL LVL3 (GOWN DISPOSABLE) ×1 IMPLANT
GOWN STRL REUS W/TWL LRG LVL3 (GOWN DISPOSABLE) ×2
GOWN STRL REUS W/TWL XL LVL3 (GOWN DISPOSABLE) ×1
HEMOSTAT SNOW SURGICEL 2X4 (HEMOSTASIS) IMPLANT
KIT BASIN OR (CUSTOM PROCEDURE TRAY) ×2 IMPLANT
KIT TURNOVER KIT B (KITS) ×2 IMPLANT
MARKER GRAFT CORONARY BYPASS (MISCELLANEOUS) IMPLANT
NS IRRIG 1000ML POUR BTL (IV SOLUTION) ×4 IMPLANT
PACK PERIPHERAL VASCULAR (CUSTOM PROCEDURE TRAY) ×2 IMPLANT
PAD ARMBOARD 7.5X6 YLW CONV (MISCELLANEOUS) ×4 IMPLANT
SET COLLECT BLD 21X3/4 12 (NEEDLE) IMPLANT
STOPCOCK 4 WAY LG BORE MALE ST (IV SETS) IMPLANT
SUT ETHILON 3 0 PS 1 (SUTURE) IMPLANT
SUT PROLENE 5 0 C 1 24 (SUTURE) ×8 IMPLANT
SUT PROLENE 6 0 BV (SUTURE) ×2 IMPLANT
SUT SILK 3 0 (SUTURE)
SUT SILK 3-0 18XBRD TIE 12 (SUTURE) IMPLANT
SUT VIC AB 2-0 CT1 27 (SUTURE)
SUT VIC AB 2-0 CT1 TAPERPNT 27 (SUTURE) IMPLANT
SUT VIC AB 3-0 SH 27 (SUTURE)
SUT VIC AB 3-0 SH 27X BRD (SUTURE) IMPLANT
SUT VICRYL 4-0 PS2 18IN ABS (SUTURE) IMPLANT
TAPE CLOTH SURG 4X10 WHT LF (GAUZE/BANDAGES/DRESSINGS) ×2 IMPLANT
TOWEL GREEN STERILE (TOWEL DISPOSABLE) ×2 IMPLANT
TRAY FOLEY MTR SLVR 16FR STAT (SET/KITS/TRAYS/PACK) ×2 IMPLANT
TUBING EXTENTION W/L.L. (IV SETS) IMPLANT
UNDERPAD 30X30 (UNDERPADS AND DIAPERS) ×2 IMPLANT
WATER STERILE IRR 1000ML POUR (IV SOLUTION) ×2 IMPLANT

## 2018-02-03 SURGICAL SUPPLY — 10 items
CATH S G BIP PACING (SET/KITS/TRAYS/PACK) ×2 IMPLANT
KIT MICROPUNCTURE NIT STIFF (SHEATH) ×2 IMPLANT
PACK CARDIAC CATHETERIZATION (CUSTOM PROCEDURE TRAY) ×2 IMPLANT
PINNACLE LONG 6F 25CM (SHEATH) ×2
SHEATH BRITE TIP 6FR 35CM (SHEATH) ×2 IMPLANT
SHEATH INTRO PINNACLE 6F 25CM (SHEATH) ×1 IMPLANT
SHEATH PINNACLE 6F 10CM (SHEATH) ×2 IMPLANT
SHEATH PROBE COVER 6X72 (BAG) ×2 IMPLANT
SLEEVE REPOSITIONING LENGTH 30 (MISCELLANEOUS) ×2 IMPLANT
WIRE EMERALD 3MM-J .035X150CM (WIRE) ×2 IMPLANT

## 2018-02-03 NOTE — Telephone Encounter (Signed)
Pt in ER for evaluation.

## 2018-02-03 NOTE — Op Note (Signed)
    Patient name: Jared Norton. MRN: [id number] DOB: [birth year] Sex: male  02/10/2018 Pre-operative Diagnosis: Right retroperitoneal hematoma Post-operative diagnosis:  Same Surgeon:  Erlene Quan C. Donzetta Matters, MD Assistants: Tinnie Gens, MD, Laurence Slate, PA Procedure Performed: 1.  Exploration of right groin with primary repair of common femoral artery 2.  Placement of negative pressure dressing  Indications: 77 year old male recently underwent aortic valve replacement presented with CVA was administered TPA.  He has a right arterial groin sheath in place in the common femoral artery and also has a right femoral vein pacer sheath placed.  He became hypotensive earlier in the day was found to have swelling in the right groin CT scan demonstrated a very large hematoma and he was indicated for urgent exploration.  Findings: The existing sheath in the right common femoral artery was adequately placed this was removed and the arteriotomy was suture repaired.  The previous arteriotomy from the 6 French sheath was freely bleeding and this was repaired with 5-0 Prolene suture.  There was significant bleeding in the retroperitoneum without any frank clotting.  This was all evacuated and the retroperitoneum was packed and a wound VAC was placed.   Procedure:  The patient was identified in the holding area and taken to the operating room where he was placed supine on the operating table.  He had previously been intubated.  Antibiotics were administered and a timeout was called.  We began with a longitudinal incision of the right groin overlying the existing arterial sheath.  I did remove the side-port and clamped that sheath itself.  We dissected down the common femoral artery exposed an area of bleeding from a previous sheath site this was repaired with 5-0 Prolene suture.  We then placed a U stitch remove the existing sheath since this stitch down.  We then evacuated nearly a liter of frank blood from the  retroperitoneum.  We irrigated were able to evaluate the retroperitoneum fully from the groin incision so did not make an abdominal incision.  We then packed it with labs and wound VAC was fashioned in the groin.  He was transferred back to the ICU remained unstable on 2 vasopressors and intubated.  EBL 1 L   Brandon C. Donzetta Matters, MD [facility] Office: [phone] Pager: [phone]

## 2018-02-03 NOTE — Significant Event (Addendum)
PCCM STATUPDS UATE  Pt out of the OR at 2100  At bedside at 2110 Wife and daughter at bedside  At 30 mins post OR wound vac output is 750cc sanguinous On very high doses of Levophed and he remains hypotensive Discussed with Neurology Dr Lorraine Lax at bedside risk/benefit or reversal with TXA at this point per clinical pharmacist it was ordered and delivered but held for post op until bleeding vessel could be repaired per vascular sx.   Physical Exam: General: well nourished male non responsive Neuro: GCS 3 pinpoint pupils not on sedation last sedation was prior to OR during intubation HEENT: ETT in oropharynx no blood in the tube no mucosal bleeding, fullness noted in neck especially on the left lateral aspect no ecchymoses noted no crepitus on palpation Cardio: S1 and S2 Pulmonary: coarse breath sounds bilaterally Abdomen soft with full flanks no ecchymoses no drains GU: indwelling foley cath, right groin wound vac with sanguinous output, clean dry dressing on left from previous TAVR   Discussed with clinical team and plan is as follows: 1.Massive Transfusion Protocol Pt received 12 units of PRBCs per RN report- however Blood bank only released 6 units to this patient. Not sure of discrepancy. Will get DIC panel. Administer 1 pool of cryo, 4 FFPs ordered , 2 plts and in addition will administer TXA now in setting of acute bleed s/p TPA. Vascular and Neuro in agreement. 2. Hypovolemic shock on high dose levo, transfuse blood products as stated, will check H/H,  Has A-line for monitoring MAP goal >63mmHg, Massive transfusion protocol in place. Monitoring outout from [facility] from right groin. 3. Poor Neurological Exam GCS 3 not on sedation pinpoint pupils concern for hemorrhagic conversion CTH w/o contrast when pt is hemodynamically stable 4. Severe anion Gap Metabolic Acidosis: most likely lactic acidosis from hypoperfusion given 2 amps of bicarb stat and started on Bicarb ggt. If we can improve  hemodynamics this may improve.  I discussed with the wife and daughter regarding the poor prognosis given current clinical exam and poor hemodynamics. We discussed what they would like to happen in the event that his heart stops and they both stated that they did not want him to receive compressions or call a code blue. We discussed that he would continue to receive full support at this time in hopes of reversal of the bleeding and stabilizing hemodynamics and once those were achieved he would receive a CT Head to assess his change in mental status and if he suffered anoxic injury or a hemorrhagic conversion.    I, Dr Seward Carol have personally reviewed patient's available data, including medical history, events of note, physical examination and test results as part of my evaluation. I have discussed with other care providers such as clinical pharmacist, RN, Vascular Surgeon and Neurology.  In addition,  I personally evaluated patient as stated above  The patient is critically ill with multiple organ systems failure and requires high complexity decision making for assessment and support, frequent evaluation and titration of therapies, application of advanced monitoring technologies and extensive interpretation of multiple databases.   Critical Care Time devoted to patient care services described in this note is 67 Minutes. This time reflects time of care of this signee Dr Seward Carol. This critical care time does not reflect procedure time, or teaching time or supervisory time of NP but could involve care discussion time   CC TIME: 63 minutes CODE STATUS: DNR no compressions remains intubated and on full support for  now DISPOSITION: 2H ICU post Op PROGNOSIS:Guarded-. Poor Family: wife and daughter at bedside and being update of clinical condition as things change.  Dr. Seward Carol Pulmonary Critical Care Medicine  02/16/2018 10:02 PM

## 2018-02-03 NOTE — Transfer of Care (Signed)
Immediate Anesthesia Transfer of Care Note  Patient: Jared Norton.  Procedure(s) Performed: IR WITH ANESTHESIA (N/A )  Patient Location: ICU  Anesthesia Type:MAC  Level of Consciousness: awake, alert  and oriented  Airway & Oxygen Therapy: Patient Spontanous Breathing  Post-op Assessment: Report given to RN  Post vital signs: Reviewed and stable  Last Vitals:  Vitals Value Taken Time  BP 87/43 02/26/2018  4:20 PM  Temp    Pulse 29 02/13/2018  4:25 PM  Resp 14 02/21/2018  4:25 PM  SpO2 100 % 02/13/2018  4:25 PM  Vitals shown include unvalidated device data.  Last Pain: There were no vitals filed for this visit.       Complications: No apparent anesthesia complications

## 2018-02-03 NOTE — Anesthesia Preprocedure Evaluation (Addendum)
Anesthesia Evaluation  Patient identified by MRN, date of birth, ID band Patient awake    Reviewed: Allergy & Precautions, H&P , NPO status , Patient's Chart, lab work & pertinent test results  History of Anesthesia Complications (+) PONV and history of anesthetic complications  Airway Mallampati: II   Neck ROM: full    Dental no notable dental hx.    Pulmonary neg pulmonary ROS,    breath sounds clear to auscultation       Cardiovascular + Valvular Problems/Murmurs AS  Rhythm:regular Rate:Normal  S/P TAVR on 10/1.   Neuro/Psych CVA negative neurological ROS     GI/Hepatic negative GI ROS, Neg liver ROS,   Endo/Other  negative endocrine ROS  Renal/GU negative Renal ROS  negative genitourinary   Musculoskeletal negative musculoskeletal ROS (+)   Abdominal Normal abdominal exam  (+)   Peds  Hematology  (+) anemia ,   Anesthesia Other Findings   Reproductive/Obstetrics                             Anesthesia Physical  Anesthesia Plan  ASA: III and emergent  Anesthesia Plan: MAC   Post-op Pain Management:    Induction: Intravenous  PONV Risk Score and Plan: 2 and Ondansetron, Dexamethasone and Treatment may vary due to age or medical condition  Airway Management Planned: Natural Airway, Simple Face Mask and Nasal Cannula  Additional Equipment: Arterial line  Intra-op Plan:   Post-operative Plan:   Informed Consent: I have reviewed the patients History and Physical, chart, labs and discussed the procedure including the risks, benefits and alternatives for the proposed anesthesia with the patient or authorized representative who has indicated his/her understanding and acceptance.     Plan Discussed with: CRNA, Anesthesiologist and Surgeon  Anesthesia Plan Comments:        Anesthesia Quick Evaluation

## 2018-02-03 NOTE — Telephone Encounter (Signed)
Patients insurance is active and benefits verified through [employer] - No co-pay, no deductible, out of pocket amount of $5,500/$410.22 has been met, 20% co-insurance, and no pre-authorization is required. Spoke with Lenna Gilford from [facility] - Reference #[id number] 02/27/2018 [id number]  Will make initial call to patient in regards to Cardiac Rehab. If interested, patient will need to complete follow up appt. Once completed, patient will be contacted for scheduling.

## 2018-02-03 NOTE — Sedation Documentation (Signed)
Probation officer gave report to [place], Therapist, sports. Sheath checked with nurse as well.

## 2018-02-03 NOTE — ED Provider Notes (Signed)
[facility]ERGENCY DEPARTMENT Provider Note   CSN: [id number] Arrival date & time: 02/08/2018  1343   An emergency department physician performed an initial assessment on this suspected stroke patient at 1343.  History   Chief Complaint No chief complaint on file.   HPI Jared Norton. is a 77 y.o. male.  77yo M w/ PMH including recent TAVR due to aortic stenosis who p/w aphasia.  Patient's wife reported that today he had an episode of syncope or near syncope.  They called his cardiologist who said to call EMS to run an EKG.  While he was with EMS, he suddenly developed aphasia at 1:30 PM.  A code stroke was called in route. He had TAVR 2 days ago.   LEVEL 5 CAVEAT DUE TO APHASIA  The history is provided by the spouse and the EMS personnel.    Past Medical History:  Diagnosis Date  . Cataract, bilateral   . Complication of anesthesia    ALLERGY  TO ETHER  . 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 Severe aortic stenosis     Patient Active Problem List   Diagnosis Date Noted  . CVA (cerebral vascular accident) ([place]) 02/07/2018  . Sinus bradycardia 02/02/2018  . Bundle branch block 02/02/2018  . 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 Severe aortic stenosis 12/28/2017    Past Surgical History:  Procedure Laterality Date  . CATARACT EXTRACTION Bilateral 2015  . COLONOSCOPY  2005   [place], [place] (unknown MD)  . CORONARY ANGIOGRAPHY N/A 12/28/2017   Procedure: CORONARY ANGIOGRAPHY (CATH LAB);  Surgeon: Sherren Mocha, MD;  Location: [facility];  Service: Cardiovascular;  Laterality: N/A;  . ELECTROCARDIOGRAM  2015   murmur  . HERNIA REPAIR     childhood  . INTRAOPERATIVE TRANSTHORACIC ECHOCARDIOGRAM N/A 02/01/2018   Procedure: INTRAOPERATIVE TRANSTHORACIC ECHOCARDIOGRAM;  Surgeon: Sherren Mocha, MD;  Location: [facility];  Service: Open Heart Surgery;  Laterality: N/A;  . TONSILLECTOMY     Childhood  . TRANSCATHETER AORTIC VALVE REPLACEMENT, TRANSFEMORAL  02/01/2018  . TRANSCATHETER AORTIC VALVE REPLACEMENT, TRANSFEMORAL N/A 02/01/2018   Procedure: TRANSCATHETER AORTIC VALVE REPLACEMENT, TRANSFEMORAL. 2m EDWARDS SAPIEN 3 THV.;  Surgeon: CSherren Mocha MD;  Location: [facility]  Service: Open Heart Surgery;  Laterality: N/A;        Home Medications    Prior to Admission medications   Medication Sig Start Date End Date Taking? Authorizing Provider  aspirin EC 81 MG tablet Take 1 tablet (81 mg total) by mouth daily. 02/02/18   TEileen Stanford PA-C  clopidogrel (PLAVIX) 75 MG tablet Take 1 tablet (75 mg total) by mouth daily with breakfast. 02/07/2018   TEileen Stanford PA-C  Coenzyme Q10 (COQ10) 100 MG CAPS Take 100 mg by mouth daily.    [provider]  hydrocortisone 2.5 % cream Apply 1 application topically 2 (two) times daily as needed (for itching).  09/21/17   [provider]  metroNIDAZOLE (METROGEL) 0.75 % gel Apply 1 application topically 2 (two) times daily as needed (for rosacea).  09/20/17   [provider]  Multiple Vitamins-Minerals (MULTIVITAMIN WITH MINERALS) tablet Take 1 tablet by mouth daily.    [provider]  Omega-3 Fatty Acids (OMEGA 3 PO) Take 1 capsule by mouth daily.     [provider]    [facility]  History Family History  Problem Relation Age of Onset  . Colon cancer Neg Hx   . Rectal cancer Neg Hx   . Stomach cancer Neg Hx     Social History Social History   Tobacco Use  . Smoking status: Never Smoker  . Smokeless tobacco: Never Used  Substance Use Topics  . Alcohol use: Yes    Alcohol/week: 3.0 standard drinks    Types: 3 Standard drinks or equivalent per week  . Drug use: Never     Allergies   Ether   Review of Systems Review of Systems  Unable to perform ROS: Mental status change       Physical Exam Updated Vital Signs BP (!) 148/80   Pulse 87   Resp 17   Wt 65 kg   SpO2 100%   BMI 23.13 kg/m   Physical Exam  Constitutional: He is oriented to person, place, and time. He appears well-developed and well-nourished. No distress.  HENT:  Head: Normocephalic and atraumatic.  Moist mucous membranes  Eyes: Pupils are equal, round, and reactive to light. Conjunctivae are normal.  Neck: Neck supple.  Cardiovascular: Normal rate, regular rhythm and normal heart sounds.  No murmur heard. Pulmonary/Chest: Effort normal and breath sounds normal.  Abdominal: Soft. Bowel sounds are normal. He exhibits no distension. There is no tenderness.  Musculoskeletal: He exhibits no edema.  Neurological: He is alert and oriented to person, place, and time.  Mild facial droop at right mouth; expressive and receptive aphasia; moving all 4 extremities equally  Skin: Skin is warm and dry.  Bruising on L groin with small incision site, glue in place, clean/dry/intact; no pulsatile mass   Psychiatric:  anxious  Nursing note and vitals reviewed.    ED Treatments / Results  Labs (all labs ordered are listed, but only abnormal results are displayed) Labs Reviewed  CBC - Abnormal; Notable for the following components:      Result Value   RBC 3.74 (*)    Hemoglobin 12.7 (*)    HCT 37.0 (*)    Platelets 107 (*)    All other components within normal limits  DIFFERENTIAL - Abnormal; Notable for the following components:   Monocytes Absolute 1.3 (*)    All other components within normal limits  COMPREHENSIVE METABOLIC PANEL - Abnormal; Notable for the following components:   CO2 21 (*)    Glucose, Bld 101 (*)    Total Protein 6.4 (*)    Alkaline Phosphatase 35 (*)    All other components within normal limits  I-STAT TROPONIN, ED - Abnormal; Notable for the following components:   Troponin i, poc 0.60 (*)    All other components within normal limits  I-STAT CHEM 8, ED -  Abnormal; Notable for the following components:   Glucose, Bld 100 (*)    Calcium, Ion 1.02 (*)    Hemoglobin 12.6 (*)    HCT 37.0 (*)    All other components within normal limits  PROTIME-INR  APTT  CBG MONITORING, ED    EKG None  Radiology Ct Angio Head W Or Wo Contrast  Result Date: 02/06/2018 CLINICAL DATA:  Code stroke.  Slurred speech and confusion EXAM: CT ANGIOGRAPHY HEAD AND NECK TECHNIQUE: Multidetector CT imaging of the head and neck was performed using the standard protocol during bolus administration of intravenous contrast. Multiplanar CT image reconstructions and MIPs were obtained to evaluate the vascular anatomy. Carotid stenosis measurements (when applicable) are obtained utilizing NASCET criteria, using the  distal internal carotid diameter as the denominator. CONTRAST:  61m ISOVUE-370 IOPAMIDOL (ISOVUE-370) INJECTION 76% COMPARISON:  CT head today FINDINGS: CTA NECK FINDINGS Aortic arch: Standard branching. Imaged portion shows no evidence of aneurysm or dissection. No significant stenosis of the major arch vessel origins. Right carotid system: Mild atherosclerotic disease right carotid bulb. No significant right carotid stenosis. Left carotid system: Mild atherosclerotic disease left carotid bulb without significant stenosis Vertebral arteries: Both vertebral arteries are patent to the basilar without stenosis. Skeleton: Retrolisthesis at C4-5. Multilevel disc and facet degeneration. No acute skeletal abnormality. Other neck: Negative Upper chest: Negative Review of the MIP images confirms the above findings CTA HEAD FINDINGS Anterior circulation: Mild atherosclerotic calcification in the cavernous carotid bilaterally without significant stenosis. Right anterior and middle cerebral arteries widely patent. Left anterior cerebral artery widely patent. Left M1 widely patent. Decreased enhancement of an M3 branch adjacent to the left insula involving the anterior division of the  left MCA may represent nonocclusive clot and slow flow. Posterior circulation: Both vertebral arteries patent to the basilar PICA patent bilaterally. Basilar patent. Superior cerebellar and posterior cerebral arteries patent bilaterally. Patent posterior communicating artery bilaterally. Venous sinuses: Normal venous enhancement. Anatomic variants: None Delayed phase: Not perform Review of the MIP images confirms the above findings IMPRESSION: 1. Mild atherosclerotic disease at the carotid bifurcation. No significant carotid or vertebral artery stenosis in the neck. 2. Decreased enhancement and decreased caliber of left M3 branch adjacent to the insula which likely represents slow flow from partially occlusive thrombus given the symptoms. 3. Images reviewed with Dr. KLeonel Ramsayat approximately 1410 hours Electronically Signed   By: CFranchot GalloM.D.   On: 02/12/2018 14:21   Ct Angio Neck W Or Wo Contrast  Result Date: 02/17/2018 CLINICAL DATA:  Code stroke.  Slurred speech and confusion EXAM: CT ANGIOGRAPHY HEAD AND NECK TECHNIQUE: Multidetector CT imaging of the head and neck was performed using the standard protocol during bolus administration of intravenous contrast. Multiplanar CT image reconstructions and MIPs were obtained to evaluate the vascular anatomy. Carotid stenosis measurements (when applicable) are obtained utilizing NASCET criteria, using the distal internal carotid diameter as the denominator. CONTRAST:  557mISOVUE-370 IOPAMIDOL (ISOVUE-370) INJECTION 76% COMPARISON:  CT head today FINDINGS: CTA NECK FINDINGS Aortic arch: Standard branching. Imaged portion shows no evidence of aneurysm or dissection. No significant stenosis of the major arch vessel origins. Right carotid system: Mild atherosclerotic disease right carotid bulb. No significant right carotid stenosis. Left carotid system: Mild atherosclerotic disease left carotid bulb without significant stenosis Vertebral arteries: Both  vertebral arteries are patent to the basilar without stenosis. Skeleton: Retrolisthesis at C4-5. Multilevel disc and facet degeneration. No acute skeletal abnormality. Other neck: Negative Upper chest: Negative Review of the MIP images confirms the above findings CTA HEAD FINDINGS Anterior circulation: Mild atherosclerotic calcification in the cavernous carotid bilaterally without significant stenosis. Right anterior and middle cerebral arteries widely patent. Left anterior cerebral artery widely patent. Left M1 widely patent. Decreased enhancement of an M3 branch adjacent to the left insula involving the anterior division of the left MCA may represent nonocclusive clot and slow flow. Posterior circulation: Both vertebral arteries patent to the basilar PICA patent bilaterally. Basilar patent. Superior cerebellar and posterior cerebral arteries patent bilaterally. Patent posterior communicating artery bilaterally. Venous sinuses: Normal venous enhancement. Anatomic variants: None Delayed phase: Not perform Review of the MIP images confirms the above findings IMPRESSION: 1. Mild atherosclerotic disease at the carotid bifurcation. No significant carotid or vertebral  artery stenosis in the neck. 2. Decreased enhancement and decreased caliber of left M3 branch adjacent to the insula which likely represents slow flow from partially occlusive thrombus given the symptoms. 3. Images reviewed with Dr. Leonel Ramsay at approximately 1410 hours Electronically Signed   By: Franchot Gallo M.D.   On: 02/02/2018 14:21   Ct Head Code Stroke Wo Contrast  Result Date: 02/11/2018 CLINICAL DATA:  Code stroke.  Slurred speech confusion EXAM: CT HEAD WITHOUT CONTRAST TECHNIQUE: Contiguous axial images were obtained from the base of the skull through the vertex without intravenous contrast. COMPARISON:  None. FINDINGS: Brain: Generalized atrophy consistent with age. Chronic microvascular ischemic change in the white matter. Negative for  acute infarct.  Negative for hemorrhage or mass. Vascular: Negative for hyperdense vessel Skull: Negative Sinuses/Orbits: Mild mucosal edema paranasal sinuses. Bilateral cataract surgery. Other: None ASPECTS ([place] Stroke Program Early CT Score) - Ganglionic level infarction (caudate, lentiform nuclei, internal capsule, insula, M1-M3 cortex): 7 - Supraganglionic infarction (M4-M6 cortex): 3 Total score (0-10 with 10 being normal): 10 IMPRESSION: 1. No acute abnormality. 2. ASPECTS is 10 3. The study was reviewed with Dr. Leonel Ramsay. Electronically Signed   By: Franchot Gallo M.D.   On: 02/27/2018 14:09    Procedures .Critical Care Performed by: Sharlett Iles, MD Authorized by: Sharlett Iles, MD   Critical care provider statement:    Critical care time (minutes):  35   Critical care time was exclusive of:  Separately billable procedures and treating other patients   Critical care was necessary to treat or prevent imminent or life-threatening deterioration of the following conditions:  CNS failure or compromise   Critical care was time spent personally by me on the following activities:  Development of treatment plan with patient or surrogate, discussions with consultants, evaluation of patient's response to treatment, examination of patient, obtaining history from patient or surrogate, ordering and performing treatments and interventions, ordering and review of laboratory studies, ordering and review of radiographic studies and re-evaluation of patient's condition   (including critical care time)  Medications Ordered in ED Medications  alteplase (ACTIVASE) 1 mg/mL infusion 58.5 mg (58.5 mg Intravenous New Bag/Given 02/02/2018 1417)    Followed by  0.9 %  sodium chloride infusion (has no administration in time range)   stroke: mapping our early stages of recovery book (has no administration in time range)  0.9 %  sodium chloride infusion (has no administration in time range)    acetaminophen (TYLENOL) tablet 650 mg (has no administration in time range)    Or  acetaminophen (TYLENOL) solution 650 mg (has no administration in time range)    Or  acetaminophen (TYLENOL) suppository 650 mg (has no administration in time range)  senna-docusate (Senokot-S) tablet 1 tablet (has no administration in time range)  famotidine (PEPCID) IVPB 20 mg premix (has no administration in time range)  labetalol (NORMODYNE,TRANDATE) injection 20 mg (has no administration in time range)    And  nicardipine (CARDENE) '20mg'$  in 0.86% saline 23m IV infusion (0.1 mg/ml) (has no administration in time range)  iopamidol (ISOVUE-300) 61 % injection (has no administration in time range)  lidocaine (XYLOCAINE) 1 % (with pres) injection (has no administration in time range)  iopamidol (ISOVUE-370) 76 % injection 50 mL (50 mLs Intravenous Contrast Given 02/07/2018 1354)  ondansetron (ZOFRAN) 4 MG/2ML injection (4 mg  Given 02/15/2018 1407)     Initial Impression / Assessment and Plan / ED Course  I have reviewed the triage vital signs  and the nursing notes.  Pertinent labs & imaging results that were available during my care of the patient were reviewed by me and considered in my medical decision making (see chart for details).     Pt arrived as code stroke, neatly taken to [facility] where he was met by neurology team, Dr. Leonel Ramsay.  Head CT negative for hemorrhage.  Wife arrived and Dr. Leonel Ramsay discussed risks and benefits of TPA.  TPA was administered.  On repeat assessment while TPA running, the patient began having improvement in his aphasia. Labs show trop 0.6, no ST elevation on EKG. Pt admitted to neuro ICU for further treatment.  Final Clinical Impressions(s) / ED Diagnoses   Final diagnoses:  Aphasia  Acute CVA (cerebrovascular accident) [facility])    ED Discharge Orders    None       Rosselyn Martha, Wenda Overland, MD 02/26/2018 1552

## 2018-02-03 NOTE — Progress Notes (Signed)
Pharmacist Code Stroke Response  Notified to mix tPA at 1410 by Dr. Leonel Ramsay Delivered tPA to RN at 1415  tPA dose = 5.9 mg bolus over 1 minute followed by 52.6 mg for a total dose of 58.5 mg over 1 hour  Issues/delays encountered (if applicable): None from a pharmacy perspective. Overall we were waiting on neurology to discuss with the CV surgeon and family.   Alycia Rossetti, PharmD, BCPS Pager: [phone] 2:20 PM

## 2018-02-03 NOTE — Anesthesia Postprocedure Evaluation (Signed)
Anesthesia Post Note  Patient: Jared Norton.  Procedure(s) Performed: GROIN EXPLORATION POSSIBLE  wound vac application (Right )     Patient location during evaluation: SICU Anesthesia Type: General Level of consciousness: sedated Pain management: pain level controlled Vital Signs Assessment: post-procedure vital signs reviewed and stable Respiratory status: patient remains intubated per anesthesia plan Cardiovascular status: stable Postop Assessment: no apparent nausea or vomiting Anesthetic complications: no    Last Vitals:  Vitals:   02/21/2018 1900 02/13/2018 2035  BP: (!) 175/141 (!) 92/57  Pulse:  90  Resp: 14 (!) 24  SpO2:  100%    Last Pain: There were no vitals filed for this visit.               Tzipora Mcinroy DANIEL

## 2018-02-03 NOTE — Procedures (Signed)
S/P Lt common carotid arteriogram . RT CFA apporach. Findings. 1.No occlusions ,intraluminal filling defects or stenosis noted angiographically .

## 2018-02-03 NOTE — Telephone Encounter (Signed)
TOC Call  Left message on machine for pt to contact the office.   

## 2018-02-03 NOTE — Telephone Encounter (Signed)
The pt's wife returned my call and states that they just came inside from a walk and Mr Jared Norton collapsed in the floor.  The pt did not lose consciousness.  She does not have a way to check BP or pulse at home but she did activate the pt's heart monitor.  She assisted the pt to a nearby chair. I advised her to call 911 if the pt passes out or becomes symptomatic again and I will attempt to obtain tracing from heart monitor.    Pt's wife called back in just a few minutes and the pt is complaining of nausea and in general is not feeling well.  I advised her to call 911 now and let them evaluate the pt. We will continue to try and obtain tracings.

## 2018-02-03 NOTE — Progress Notes (Signed)
TAVR Team Note: Notified of patient's admission with aphasia and stroke. Spoke with Dr Leonel Ramsay and Dr Estanislado Pandy. The patient presents with dense aphasia 48 hours post-TAVR, diagnosed with acute stroke. He has been treated with TPA and will undergo a cerebral angiogram by Dr Estanislado Pandy. Spoke with the patient's wife and will follow-up once he is on the floor. Appreciate the care of the neurology and interventional neurology team.  Sherren Mocha 02/22/2018 3:44 PM

## 2018-02-03 NOTE — Telephone Encounter (Signed)
New Message   Leann with [employer] is calling in reference to a abnormal EKG

## 2018-02-03 NOTE — Consult Note (Signed)
NAME:  Jared Norton., MRN:  [id number], DOB:  1941-02-10, LOS: 0 ADMISSION DATE:  02/28/2018, CONSULTATION DATE:  10/3 REFERRING MD:  Dr. Leonel Ramsay, CHIEF COMPLAINT:  SOB   Brief History   77 year old male with recent TAVR admitted for acute CVA 10/3. Was given tpa and underwent thrombectomy. In the subsequent hours he became symptomatically bradycardic requiring atropine and transcutaneous pacing. He was taken to the cardiac cath lab for temp wire.   Past Medical History  Aortic stenosis s/p TAVR 10/3  Significant Hospital Events   10/1 TAVR 10/2 discharge 10/3 re-admit with CVA, got tpa, IR. Then had groin hematoma and became bradycardic. Near cardiac arrest. To cath lab for temp pacer.   Consults: date of consult/date signed off & final recs:  Cardiology 10/3 PCCM 10/3  Procedures (surgical and bedside):  Neuro IR 10/3 > ETT 10/3 > Temp pacer 10/3 >  Significant Diagnostic Tests:  CTA head 10/3 > Mild atherosclerotic disease at the carotid bifurcation. No significant carotid or vertebral artery stenosis in the neck. Decreased enhancement and decreased caliber of left M3 branch adjacent to the insula which likely represents slow flow from partially occlusive thrombus given the symptoms.  Micro Data:    Antimicrobials:     Subjective:  unable  Objective   Blood pressure (!) 160/83, pulse 87, resp. rate 17, weight 65 kg, SpO2 100 %.        Intake/Output Summary (Last 24 hours) at 02/11/2018 1709 Last data filed at 02/05/2018 1555 Gross per 24 hour  Intake 700 ml  Output 200 ml  Net 500 ml   Filed Weights   03/03/2018 1300  Weight: 65 kg    Examination: General: Elderly male on vent HENT: [place]/AT PERRL, no JVD Lungs: Clear Cardiovascular: paced Abdomen: Soft, non-distended Extremities: No acute deformity. Hematoma R groin Neuro: sedated  Resolved Hospital Problem list     Assessment & Plan:   Complete heart block/sympomatic bradycardia:  Occurred  after hematoma formed. Further exam/imaging demonstrates retroperitoneal hematoma.  - Cardiology following - Transvenous pacing.  - Correct metabolic issues as below.   Shock: hemorrhagic secondary to acute blood loss anemia in the setting of periprocedural retroperitoneal hematoma. Received TPA for CVA from 1417 to 1518.   - ICU hemodynamic monitoring - levophed for MAP goal 65 - 2 units emergency release blood STAT - CBC q 6 hours - Vascular surgery to evaluate  Inability to protect airway - full vent support - ETT verified on CT - ABG - VAP bundle - SBT in AM if stable  Acute CVA:  S/p IV tpa and neuro Ir (no clot observed via IR, recanalized with IV tpa)  Metabolic acidosis: high anion gap suspect lactic acidosis.  - check lactic - Bp stabilization  S/p TAVR - Cardiology following  Disposition / Summary of Today's Plan 02/10/2018   Critically ill in ICU. RP bleed after IR procedure and IV TPA. In shock on levophed. Rapid infusion of PRBC. Vascular to eval.     Diet: NPO Pain/Anxiety/Delirium protocol (if indicated): Fentanyl infusion, PRN versed VAP protocol (if indicated): Per protocol DVT prophylaxis: s/p TPA GI prophylaxis: PPI Hyperglycemia protocol: SSI Mobility: BR Code Status: Full Family Communication: Wife updated by cardiology  Labs   CBC: Recent Labs  Lab 01/28/18 0834  02/01/18 0941 02/01/18 1004 02/02/18 0510 02/17/2018 1347 02/06/2018 1353  WBC 4.9  --   --   --  8.8 9.7  --   NEUTROABS  --   --   --   --   --  6.2  --   HGB 14.3   < > 11.2* 11.9* 12.8* 12.7* 12.6*  HCT 42.9   < > 33.0* 35.0* 38.2* 37.0* 37.0*  MCV 100.0  --   --   --  103.2* 98.9  --   PLT 131*  --   --   --  100* 107*  --    < > = values in this interval not displayed.    Basic Metabolic Panel: Recent Labs  Lab 01/28/18 0834  02/01/18 0941 02/01/18 1004 02/02/18 0510 02/15/2018 1347 02/07/2018 1353  NA 135   < > 140 139 135 136 136  K 4.5   < > 4.1 4.2 3.7 3.5 3.5  CL  105   < > 106 105 107 101 101  CO2 21*  --   --   --  25 21*  --   GLUCOSE 94   < > 140* 137* 100* 101* 100*  BUN 20   < > 14 14 14 15 16   CREATININE 0.97   < > 0.70 0.80 0.92 0.95 0.90  CALCIUM 9.3  --   --   --  8.1* 9.1  --   MG  --   --   --   --  2.1  --   --    < > = values in this interval not displayed.   GFR: Estimated Creatinine Clearance: 64 mL/min (by C-G formula based on SCr of 0.9 mg/dL). Recent Labs  Lab 01/28/18 0834 02/02/18 0510 02/02/2018 1347  WBC 4.9 8.8 9.7    Liver Function Tests: Recent Labs  Lab 01/28/18 0834 02/15/2018 1347  AST 33 39  ALT 27 25  ALKPHOS 36* 35*  BILITOT 1.0 1.1  PROT 6.7 6.4*  ALBUMIN 4.0 4.0   No results for input(s): LIPASE, AMYLASE in the last 168 hours. No results for input(s): AMMONIA in the last 168 hours.  ABG    Component Value Date/Time   PHART 7.437 01/28/2018 0835   PCO2ART 38.1 01/28/2018 0835   PO2ART 107 01/28/2018 0835   HCO3 25.3 01/28/2018 0835   TCO2 22 02/27/2018 1353   ACIDBASEDEF 2.0 12/28/2017 1315   O2SAT 98.0 01/28/2018 0835     Coagulation Profile: Recent Labs  Lab 01/28/18 0834 02/10/2018 1347  INR 1.06 1.04    Cardiac Enzymes: No results for input(s): CKTOTAL, CKMB, CKMBINDEX, TROPONINI in the last 168 hours.  HbA1C: Hgb A1c MFr Bld  Date/Time Value Ref Range Status  01/28/2018 08:32 AM 5.8 (H) 4.8 - 5.6 % Final    Comment:    (NOTE) Pre diabetes:          5.7%-6.4% Diabetes:              >6.4% Glycemic control for   <7.0% adults with diabetes     CBG: No results for input(s): GLUCAP in the last 168 hours.  Admitting History of Present Illness.   77 year old male with PMH as below, which is significant for aortic stenosis. He had TAVR on 10/1 and was discharged 10/2 with a holter monitor. While walking in his neighborhood on 10/3 he developed some lightheadedness and called EMS. Upon EMS arrival he was aphasic and transferred to [facility] ED as a code stroke. In ED he developed  facial droop and was treated as a CVA with IV TPA after CT imaging and was taken to IR, but no occlusion was discovered. He was sent to ICU for monitoring.  In the  ICU he as initially doing well, but he developed a hematoma in the groin procedure site. Shortly after he developed symptomatic bradycardia and nearly suffered a cardiac arrest. He was treated with several doses of atropine and eventually required transcutaneous pacing. He was taken to the cardiac cath lab for temporary pacemaker placement. He required intubation in cath lab for agitation. PCCM to manage vent in ICU.    Review of Systems:   unable  Past Medical History  He,  has a past medical history of Cataract, bilateral, Complication of anesthesia, S/P TAVR (transcatheter aortic valve replacement) (02/01/2018), and Severe aortic stenosis.   Surgical History    Past Surgical History:  Procedure Laterality Date  . CATARACT EXTRACTION Bilateral 2015  . COLONOSCOPY  2005   [place], [place] (unknown MD)  . CORONARY ANGIOGRAPHY N/A 12/28/2017   Procedure: CORONARY ANGIOGRAPHY (CATH LAB);  Surgeon: Sherren Mocha, MD;  Location: [facility];  Service: Cardiovascular;  Laterality: N/A;  . ELECTROCARDIOGRAM  2015   murmur  . HERNIA REPAIR     childhood  . INTRAOPERATIVE TRANSTHORACIC ECHOCARDIOGRAM N/A 02/01/2018   Procedure: INTRAOPERATIVE TRANSTHORACIC ECHOCARDIOGRAM;  Surgeon: Sherren Mocha, MD;  Location: [facility];  Service: Open Heart Surgery;  Laterality: N/A;  . TONSILLECTOMY     Childhood  . TRANSCATHETER AORTIC VALVE REPLACEMENT, TRANSFEMORAL  02/01/2018  . TRANSCATHETER AORTIC VALVE REPLACEMENT, TRANSFEMORAL N/A 02/01/2018   Procedure: TRANSCATHETER AORTIC VALVE REPLACEMENT, TRANSFEMORAL. 18mm EDWARDS SAPIEN 3 THV.;  Surgeon: Sherren Mocha, MD;  Location: [place];  Service: Open Heart Surgery;  Laterality: N/A;     Social History   Social History   Socioeconomic History  . Marital status: Married    Spouse  name: Not on file  . Number of children: Not on file  . Years of education: Not on file  . Highest education level: Not on file  Occupational History  . Not on file  Social Needs  . Financial resource strain: Not on file  . Food insecurity:    Worry: Not on file    Inability: Not on file  . Transportation needs:    Medical: Not on file    Non-medical: Not on file  Tobacco Use  . Smoking status: Never Smoker  . Smokeless tobacco: Never Used  Substance and Sexual Activity  . Alcohol use: Yes    Alcohol/week: 3.0 standard drinks    Types: 3 Standard drinks or equivalent per week  . Drug use: Never  . Sexual activity: Not on file  Lifestyle  . Physical activity:    Days per week: Not on file    Minutes per session: Not on file  . Stress: Not on file  Relationships  . Social connections:    Talks on phone: Not on file    Gets together: Not on file    Attends religious service: Not on file    Active member of club or organization: Not on file    Attends meetings of clubs or organizations: Not on file    Relationship status: Not on file  . Intimate partner violence:    Fear of current or ex partner: Not on file    Emotionally abused: Not on file    Physically abused: Not on file    Forced sexual activity: Not on file  Other Topics Concern  . Not on file  Social History Narrative  . Not on file  ,  reports that he has never smoked. He has never used smokeless  tobacco. He reports that he drinks about 3.0 standard drinks of alcohol per week. He reports that he does not use drugs.   Family History   His family history is negative for Colon cancer, Rectal cancer, and Stomach cancer.   Allergies Allergies  Allergen Reactions  . Ether Nausea Only and Other (See Comments)    Ether used as anesthesia when he was a child     Home Medications  Prior to Admission medications   Medication Sig Start Date End Date Taking? Authorizing Provider  aspirin EC 81 MG tablet Take 1  tablet (81 mg total) by mouth daily. 02/02/18   Eileen Stanford, PA-C  clopidogrel (PLAVIX) 75 MG tablet Take 1 tablet (75 mg total) by mouth daily with breakfast. 02/05/2018   Eileen Stanford, PA-C  Coenzyme Q10 (COQ10) 100 MG CAPS Take 100 mg by mouth daily.    [provider]  hydrocortisone 2.5 % cream Apply 1 application topically 2 (two) times daily as needed (for itching).  09/21/17   [provider]  metroNIDAZOLE (METROGEL) 0.75 % gel Apply 1 application topically 2 (two) times daily as needed (for rosacea).  09/20/17   [provider]  Multiple Vitamins-Minerals (MULTIVITAMIN WITH MINERALS) tablet Take 1 tablet by mouth daily.    [provider]  Omega-3 Fatty Acids (OMEGA 3 PO) Take 1 capsule by mouth daily.     [provider]     Georgann Housekeeper, AGACNP-BC [place] Pager [phone] or [phone]  02/25/2018 5:09 PM

## 2018-02-03 NOTE — Procedures (Signed)
Intubation Procedure Note Jared Norton [id number] 28-Mar-1941  Procedure: Intubation Indications: Airway protection and maintenance  Procedure Details Consent: Unable to obtain consent because of emergent medical necessity. Time Out: Verified patient identification, verified procedure, site/side was marked, verified correct patient position, special equipment/implants available, medications/allergies/relevent history reviewed, required imaging and test results available.  Performed  Maximum sterile technique was used including gloves, hand hygiene and mask.  MAC    Evaluation Hemodynamic Status: BP stable throughout; O2 sats: stable throughout Patient's Current Condition: stable Complications: No apparent complications Patient did tolerate procedure well. Chest X-ray ordered to verify placement.  CXR: pending.   YACOUB,WESAM 02/02/2018

## 2018-02-03 NOTE — Progress Notes (Signed)
Progress Note  Patient Name: Jared Norton. Date of Encounter: 02/23/2018  Primary Cardiologist: No primary care provider on file.   Subjective   Called to see patient in neuro ICU after he presented with a stroke and was treated with IV-TPA followed by cerebral angiography from right femoral arterial access. The patient developed a left groin hematoma (site of TAVR access 48 hours ago) and during manual compression he developed profound bradycardia and hypotension requiring atropine administration. On my arrival he is unstable with periods of heart block and associated marked hypotension, ventricular pauses in excess of 4 seconds. He is administered atropine again and started on dopamine. Plans to place emergent temporary pacing wire.   Inpatient Medications    Scheduled Meds: .  stroke: mapping our early stages of recovery book   Does not apply Once  . sodium chloride   Intravenous Once  . fentaNYL      . fentaNYL (SUBLIMAZE) injection  50 mcg Intravenous Once  . labetalol  20 mg Intravenous Once  . lidocaine       Continuous Infusions: . sodium chloride    . sodium chloride    . famotidine (PEPCID) IV    . fentaNYL infusion INTRAVENOUS 50 mcg/hr (02/14/2018 1744)  . niCARDipine    . norepinephrine (LEVOPHED) Adult infusion     PRN Meds: acetaminophen **OR** acetaminophen (TYLENOL) oral liquid 160 mg/5 mL **OR** acetaminophen, fentaNYL, lidocaine (PF), midazolam, midazolam, senna-docusate   Vital Signs    Vitals:   02/26/2018 1724 02/10/2018 1729 02/27/2018 1732 02/16/2018 1734  BP: (!) 152/53 (!) 94/48  (!) 55/28  Pulse: (!) 50 60 (!) 59 80  Resp: (!) 23 (!) 23  (!) 23  SpO2: 100% 100%  100%  Weight:        Intake/Output Summary (Last 24 hours) at 02/06/2018 1852 Last data filed at 02/23/2018 1555 Gross per 24 hour  Intake 700 ml  Output 200 ml  Net 500 ml   Filed Weights   02/23/2018 1300  Weight: 65 kg    Telemetry    Periods of complete AV block, junctional  bradycardia - Personally Reviewed   Physical Exam  Lethargic male, moderate distress GEN: ill-appearing Neck: No JVD Cardiac: RRR, 2/6 systolic murmur at the LSB  Respiratory: Clear to auscultation bilaterally. GI: firm, tender in the lower abdomen MS: No edema; No deformity. Neuro:  aphasic Groin: left groin hematoma present Skin: pale appearing  Labs    Chemistry Recent Labs  Lab 01/28/18 0834  02/02/18 0510 03/03/2018 1347 02/16/2018 1353  NA 135   < > 135 136 136  K 4.5   < > 3.7 3.5 3.5  CL 105   < > 107 101 101  CO2 21*  --  25 21*  --   GLUCOSE 94   < > 100* 101* 100*  BUN 20   < > 14 15 16   CREATININE 0.97   < > 0.92 0.95 0.90  CALCIUM 9.3  --  8.1* 9.1  --   PROT 6.7  --   --  6.4*  --   ALBUMIN 4.0  --   --  4.0  --   AST 33  --   --  39  --   ALT 27  --   --  25  --   ALKPHOS 36*  --   --  35*  --   BILITOT 1.0  --   --  1.1  --  GFRNONAA >60  --  >60 >60  --   GFRAA >60  --  >60 >60  --   ANIONGAP 9  --  3* 14  --    < > = values in this interval not displayed.     Hematology Recent Labs  Lab 01/28/18 0834  02/02/18 0510 02/14/2018 1347 02/22/2018 1353  WBC 4.9  --  8.8 9.7  --   RBC 4.29  --  3.70* 3.74*  --   HGB 14.3   < > 12.8* 12.7* 12.6*  HCT 42.9   < > 38.2* 37.0* 37.0*  MCV 100.0  --  103.2* 98.9  --   MCH 33.3  --  34.6* 34.0  --   MCHC 33.3  --  33.5 34.3  --   RDW 12.7  --  12.7 12.4  --   PLT 131*  --  100* 107*  --    < > = values in this interval not displayed.    Cardiac EnzymesNo results for input(s): TROPONINI in the last 168 hours.  Recent Labs  Lab 02/05/2018 1351  TROPIPOC 0.60*     BNP Recent Labs  Lab 01/28/18 0835  BNP 334.6*     DDimer No results for input(s): DDIMER in the last 168 hours.   Radiology    Ct Abdomen Pelvis Wo Contrast  Result Date: 03/02/2018 CLINICAL DATA:  Shock. EXAM: CT CHEST, ABDOMEN AND PELVIS WITHOUT CONTRAST TECHNIQUE: Multidetector CT imaging of the chest, abdomen and pelvis was  performed following the standard protocol without IV contrast. COMPARISON:  Portable chest dated 02/01/2018. Chest, abdomen and pelvis CTA dated 12/30/2017. FINDINGS: CT CHEST FINDINGS Cardiovascular: Diffuse low density of the blood relative to the arterial walls. Transcatheter aortic valve. Intracardiac pacemaker tip in the right ventricular apex, entering from the inferior vena cava. Mediastinum/Nodes: Endotracheal tube in the trachea. Small amount of intravenous air. No enlarged lymph nodes. Unremarkable included portions of the thyroid gland. Lungs/Pleura: No significant change in a small number of 2-3 mm nodules in both lungs. Minimal bilateral dependent atelectasis. Musculoskeletal: Thoracic and lower cervical spine degenerative changes. CT ABDOMEN PELVIS FINDINGS Hepatobiliary: No focal liver abnormality is seen. No gallstones, gallbladder wall thickening, or biliary dilatation. Pancreas: Unremarkable. No pancreatic ductal dilatation or surrounding inflammatory changes. Spleen: Normal in size without focal abnormality. Adrenals/Urinary Tract: The urinary bladder is displaced to the left with flattening of the bladder by a very large right pelvic hematoma. The adrenal glands and kidneys are unremarkable. The left ureters partially visualized and is grossly normal. The right ureter is also partially visualized due to excreted contrast and the visualized portions are unremarkable. Stomach/Bowel: The right abdominal and pelvic bowel loops are displaced anteriorly and to the left by a very large retroperitoneal hematoma centered on the right, extending across the midline in the abdomen. No bowel dilatation is seen. Vascular/Lymphatic: Atheromatous arterial calcifications. A right femoral vein catheter is in place with its tip in the mid to lower pelvis. There is also a right femoral pacemaker lead extending superiorly through the inferior vena cava to the right atrium. Reproductive: Prostate is unremarkable.  Other: Very large retroperitoneal hematoma on the right extending from the inferior pelvis to the liver. This is displacing and compressing the urinary bladder and displacing the colon and small bowel, as described above. Musculoskeletal: Extensive lumbar spine degenerative changes with straightening of the normal lordosis and mild to moderate dextroconvex scoliosis. IMPRESSION: 1. Very large retroperitoneal hematoma on the right extending from  the inferior pelvis to the liver. This extends across the midline to the left in the abdomen. This is displacing and compressing the urinary bladder and displacing the small bowel and colon. 2. Diffuse low density of the blood relative to the arterial walls, compatible with anemia. 3. Stable small number of 2-3 mm nodules in both lungs. These do not require follow-up. Critical Value/emergent results were called by telephone at the time of interpretation on 02/21/2018 at 6:44 pm to Dr. Burt Knack, who verbally acknowledged these results. Electronically Signed   By: Claudie Revering M.D.   On: 02/02/2018 18:45   Ct Angio Head W Or Wo Contrast  Result Date: 02/26/2018 CLINICAL DATA:  Code stroke.  Slurred speech and confusion EXAM: CT ANGIOGRAPHY HEAD AND NECK TECHNIQUE: Multidetector CT imaging of the head and neck was performed using the standard protocol during bolus administration of intravenous contrast. Multiplanar CT image reconstructions and MIPs were obtained to evaluate the vascular anatomy. Carotid stenosis measurements (when applicable) are obtained utilizing NASCET criteria, using the distal internal carotid diameter as the denominator. CONTRAST:  45mL ISOVUE-370 IOPAMIDOL (ISOVUE-370) INJECTION 76% COMPARISON:  CT head today FINDINGS: CTA NECK FINDINGS Aortic arch: Standard branching. Imaged portion shows no evidence of aneurysm or dissection. No significant stenosis of the major arch vessel origins. Right carotid system: Mild atherosclerotic disease right carotid bulb.  No significant right carotid stenosis. Left carotid system: Mild atherosclerotic disease left carotid bulb without significant stenosis Vertebral arteries: Both vertebral arteries are patent to the basilar without stenosis. Skeleton: Retrolisthesis at C4-5. Multilevel disc and facet degeneration. No acute skeletal abnormality. Other neck: Negative Upper chest: Negative Review of the MIP images confirms the above findings CTA HEAD FINDINGS Anterior circulation: Mild atherosclerotic calcification in the cavernous carotid bilaterally without significant stenosis. Right anterior and middle cerebral arteries widely patent. Left anterior cerebral artery widely patent. Left M1 widely patent. Decreased enhancement of an M3 branch adjacent to the left insula involving the anterior division of the left MCA may represent nonocclusive clot and slow flow. Posterior circulation: Both vertebral arteries patent to the basilar PICA patent bilaterally. Basilar patent. Superior cerebellar and posterior cerebral arteries patent bilaterally. Patent posterior communicating artery bilaterally. Venous sinuses: Normal venous enhancement. Anatomic variants: None Delayed phase: Not perform Review of the MIP images confirms the above findings IMPRESSION: 1. Mild atherosclerotic disease at the carotid bifurcation. No significant carotid or vertebral artery stenosis in the neck. 2. Decreased enhancement and decreased caliber of left M3 branch adjacent to the insula which likely represents slow flow from partially occlusive thrombus given the symptoms. 3. Images reviewed with Dr. Leonel Ramsay at approximately 1410 hours Electronically Signed   By: Franchot Gallo M.D.   On: 02/19/2018 14:21   Ct Head Wo Contrast  Result Date: 02/27/2018 CLINICAL DATA:  Follow-up stroke. EXAM: CT HEAD WITHOUT CONTRAST TECHNIQUE: Contiguous axial images were obtained from the base of the skull through the vertex without intravenous contrast. COMPARISON:  Earlier  today. FINDINGS: Brain: Stable mild-to-moderate enlargement of the ventricles and cortical sulci. Stable mild patchy white matter low density in both cerebral hemispheres. No intracranial hemorrhage, mass lesion or CT evidence of acute infarction. Vascular: No hyperdense vessel or unexpected calcification. Skull: Normal. Negative for fracture or focal lesion. Sinuses/Orbits: Stable small right maxillary sinus retention cyst. Status post bilateral cataract extraction. Other: None. IMPRESSION: 1. No acute abnormality. 2. Stable mild to moderate diffuse cerebral and cerebellar atrophy. 3. Stable mild chronic small vessel white matter ischemic changes in both  cerebral hemispheres. Electronically Signed   By: Claudie Revering M.D.   On: 02/23/2018 18:17   Ct Angio Neck W Or Wo Contrast  Result Date: 02/09/2018 CLINICAL DATA:  Code stroke.  Slurred speech and confusion EXAM: CT ANGIOGRAPHY HEAD AND NECK TECHNIQUE: Multidetector CT imaging of the head and neck was performed using the standard protocol during bolus administration of intravenous contrast. Multiplanar CT image reconstructions and MIPs were obtained to evaluate the vascular anatomy. Carotid stenosis measurements (when applicable) are obtained utilizing NASCET criteria, using the distal internal carotid diameter as the denominator. CONTRAST:  67mL ISOVUE-370 IOPAMIDOL (ISOVUE-370) INJECTION 76% COMPARISON:  CT head today FINDINGS: CTA NECK FINDINGS Aortic arch: Standard branching. Imaged portion shows no evidence of aneurysm or dissection. No significant stenosis of the major arch vessel origins. Right carotid system: Mild atherosclerotic disease right carotid bulb. No significant right carotid stenosis. Left carotid system: Mild atherosclerotic disease left carotid bulb without significant stenosis Vertebral arteries: Both vertebral arteries are patent to the basilar without stenosis. Skeleton: Retrolisthesis at C4-5. Multilevel disc and facet degeneration. No  acute skeletal abnormality. Other neck: Negative Upper chest: Negative Review of the MIP images confirms the above findings CTA HEAD FINDINGS Anterior circulation: Mild atherosclerotic calcification in the cavernous carotid bilaterally without significant stenosis. Right anterior and middle cerebral arteries widely patent. Left anterior cerebral artery widely patent. Left M1 widely patent. Decreased enhancement of an M3 branch adjacent to the left insula involving the anterior division of the left MCA may represent nonocclusive clot and slow flow. Posterior circulation: Both vertebral arteries patent to the basilar PICA patent bilaterally. Basilar patent. Superior cerebellar and posterior cerebral arteries patent bilaterally. Patent posterior communicating artery bilaterally. Venous sinuses: Normal venous enhancement. Anatomic variants: None Delayed phase: Not perform Review of the MIP images confirms the above findings IMPRESSION: 1. Mild atherosclerotic disease at the carotid bifurcation. No significant carotid or vertebral artery stenosis in the neck. 2. Decreased enhancement and decreased caliber of left M3 branch adjacent to the insula which likely represents slow flow from partially occlusive thrombus given the symptoms. 3. Images reviewed with Dr. Leonel Ramsay at approximately 1410 hours Electronically Signed   By: Franchot Gallo M.D.   On: 02/09/2018 14:21   Ct Chest Wo Contrast  Result Date: 02/03/2018 CLINICAL DATA:  Shock. EXAM: CT CHEST, ABDOMEN AND PELVIS WITHOUT CONTRAST TECHNIQUE: Multidetector CT imaging of the chest, abdomen and pelvis was performed following the standard protocol without IV contrast. COMPARISON:  Portable chest dated 02/01/2018. Chest, abdomen and pelvis CTA dated 12/30/2017. FINDINGS: CT CHEST FINDINGS Cardiovascular: Diffuse low density of the blood relative to the arterial walls. Transcatheter aortic valve. Intracardiac pacemaker tip in the right ventricular apex, entering  from the inferior vena cava. Mediastinum/Nodes: Endotracheal tube in the trachea. Small amount of intravenous air. No enlarged lymph nodes. Unremarkable included portions of the thyroid gland. Lungs/Pleura: No significant change in a small number of 2-3 mm nodules in both lungs. Minimal bilateral dependent atelectasis. Musculoskeletal: Thoracic and lower cervical spine degenerative changes. CT ABDOMEN PELVIS FINDINGS Hepatobiliary: No focal liver abnormality is seen. No gallstones, gallbladder wall thickening, or biliary dilatation. Pancreas: Unremarkable. No pancreatic ductal dilatation or surrounding inflammatory changes. Spleen: Normal in size without focal abnormality. Adrenals/Urinary Tract: The urinary bladder is displaced to the left with flattening of the bladder by a very large right pelvic hematoma. The adrenal glands and kidneys are unremarkable. The left ureters partially visualized and is grossly normal. The right ureter is also partially visualized due  to excreted contrast and the visualized portions are unremarkable. Stomach/Bowel: The right abdominal and pelvic bowel loops are displaced anteriorly and to the left by a very large retroperitoneal hematoma centered on the right, extending across the midline in the abdomen. No bowel dilatation is seen. Vascular/Lymphatic: Atheromatous arterial calcifications. A right femoral vein catheter is in place with its tip in the mid to lower pelvis. There is also a right femoral pacemaker lead extending superiorly through the inferior vena cava to the right atrium. Reproductive: Prostate is unremarkable. Other: Very large retroperitoneal hematoma on the right extending from the inferior pelvis to the liver. This is displacing and compressing the urinary bladder and displacing the colon and small bowel, as described above. Musculoskeletal: Extensive lumbar spine degenerative changes with straightening of the normal lordosis and mild to moderate dextroconvex  scoliosis. IMPRESSION: 1. Very large retroperitoneal hematoma on the right extending from the inferior pelvis to the liver. This extends across the midline to the left in the abdomen. This is displacing and compressing the urinary bladder and displacing the small bowel and colon. 2. Diffuse low density of the blood relative to the arterial walls, compatible with anemia. 3. Stable small number of 2-3 mm nodules in both lungs. These do not require follow-up. Critical Value/emergent results were called by telephone at the time of interpretation on 02/24/2018 at 6:44 pm to Dr. Burt Knack, who verbally acknowledged these results. Electronically Signed   By: Claudie Revering M.D.   On: 02/18/2018 18:45   Ct Head Code Stroke Wo Contrast  Result Date: 02/05/2018 CLINICAL DATA:  Code stroke.  Slurred speech confusion EXAM: CT HEAD WITHOUT CONTRAST TECHNIQUE: Contiguous axial images were obtained from the base of the skull through the vertex without intravenous contrast. COMPARISON:  None. FINDINGS: Brain: Generalized atrophy consistent with age. Chronic microvascular ischemic change in the white matter. Negative for acute infarct.  Negative for hemorrhage or mass. Vascular: Negative for hyperdense vessel Skull: Negative Sinuses/Orbits: Mild mucosal edema paranasal sinuses. Bilateral cataract surgery. Other: None ASPECTS ([place] Stroke Program Early CT Score) - Ganglionic level infarction (caudate, lentiform nuclei, internal capsule, insula, M1-M3 cortex): 7 - Supraganglionic infarction (M4-M6 cortex): 3 Total score (0-10 with 10 being normal): 10 IMPRESSION: 1. No acute abnormality. 2. ASPECTS is 10 3. The study was reviewed with Dr. Leonel Ramsay. Electronically Signed   By: Franchot Gallo M.D.   On: 03/02/2018 14:09    Patient Profile     77 y.o. male with acute stroke 48 hours after TAVR, now with symptomatic bradycardia and hemodynamic instability  Assessment & Plan    1. Complete heart block, symptomatic bradycardia:  treated with atropine, dopamine. We have placed transcutaneous pacing pads and plan on emergent transvenous pacemaker placement. Wife at bedside informed of plans, understands and agrees.  2. Shock: post-stroke treated with TPA. Concerned about groin/retroperitoneal bleed. Consider stat CT once patient is further stabilized with temporary pacemaker placement. Stat type and cross, transfuse PRBC's as needed. Start vasopressor Rx with dopamine and levophed.   3. Acute stroke: per neuro team.   The patient is critically ill with severe hemodynamic instability and requires high complexity decision making for assessment and support, frequent evaluation and titration of therapies, application of advanced monitoring technologies and extensive interpretation of multiple databases. Appreciate care of the CCM and neuro teams.   Critical Care Time devoted to patient care services described in this note is 45 minutes   For questions or updates, please contact [facility] Please consult [email or web address] for  contact info under      Signed, Sherren Mocha, MD  02/17/2018, 6:52 PM

## 2018-02-03 NOTE — Consult Note (Signed)
Hospital Consult    Reason for Consult:  Right retroperitoneal hematoma Referring Physician:  Dr. Burt Knack MRN #:  [id number]  History of Present Illness: This is a 77 y.o. male with recent TAVR had a stroke received TPA underwent endovascular salvage procedure with right groin access.  Now has a large respirator hematoma intubated on 2 pressors.  Past Medical History:  Diagnosis Date  . Cataract, bilateral   . Complication of anesthesia    ALLERGY  TO ETHER  . 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 Severe aortic stenosis     Past Surgical History:  Procedure Laterality Date  . CATARACT EXTRACTION Bilateral 2015  . COLONOSCOPY  2005   [place], [place] (unknown MD)  . CORONARY ANGIOGRAPHY N/A 12/28/2017   Procedure: CORONARY ANGIOGRAPHY (CATH LAB);  Surgeon: Sherren Mocha, MD;  Location: [place] [facility];  Service: Cardiovascular;  Laterality: N/A;  . ELECTROCARDIOGRAM  2015   murmur  . HERNIA REPAIR     childhood  . INTRAOPERATIVE TRANSTHORACIC ECHOCARDIOGRAM N/A 02/01/2018   Procedure: INTRAOPERATIVE TRANSTHORACIC ECHOCARDIOGRAM;  Surgeon: Sherren Mocha, MD;  Location: [facility];  Service: Open Heart Surgery;  Laterality: N/A;  . TONSILLECTOMY     Childhood  . TRANSCATHETER AORTIC VALVE REPLACEMENT, TRANSFEMORAL  02/01/2018  . TRANSCATHETER AORTIC VALVE REPLACEMENT, TRANSFEMORAL N/A 02/01/2018   Procedure: TRANSCATHETER AORTIC VALVE REPLACEMENT, TRANSFEMORAL. 75mm EDWARDS SAPIEN 3 THV.;  Surgeon: Sherren Mocha, MD;  Location: [facility];  Service: Open Heart Surgery;  Laterality: N/A;    Allergies  Allergen Reactions  . Ether Nausea Only and Other (See Comments)    Ether used as anesthesia when he was a child    Prior to Admission medications   Medication Sig Start Date End Date Taking? Authorizing Provider  aspirin EC 81 MG tablet Take 1 tablet (81  mg total) by mouth daily. 02/02/18   Eileen Stanford, PA-C  clopidogrel (PLAVIX) 75 MG tablet Take 1 tablet (75 mg total) by mouth daily with breakfast. 02/19/2018   Eileen Stanford, PA-C  Coenzyme Q10 (COQ10) 100 MG CAPS Take 100 mg by mouth daily.    [provider]  hydrocortisone 2.5 % cream Apply 1 application topically 2 (two) times daily as needed (for itching).  09/21/17   [provider]  metroNIDAZOLE (METROGEL) 0.75 % gel Apply 1 application topically 2 (two) times daily as needed (for rosacea).  09/20/17   [provider]  Multiple Vitamins-Minerals (MULTIVITAMIN WITH MINERALS) tablet Take 1 tablet by mouth daily.    [provider]  Omega-3 Fatty Acids (OMEGA 3 PO) Take 1 capsule by mouth daily.     [provider]    Social History   Socioeconomic History  . Marital status: Married    Spouse name: Not on file  . Number of children: Not on file  . Years of education: Not on file  . Highest education level: Not on file  Occupational History  . Not on file  Social Needs  . Financial resource strain: Not on file  . Food insecurity:    Worry: Not on file    Inability: Not on file  . Transportation needs:    Medical: Not on file    Non-medical: Not on file  Tobacco Use  . Smoking status: Never Smoker  . Smokeless tobacco: Never Used  Substance and Sexual Activity  . Alcohol  use: Yes    Alcohol/week: 3.0 standard drinks    Types: 3 Standard drinks or equivalent per week  . Drug use: Never  . Sexual activity: Not on file  Lifestyle  . Physical activity:    Days per week: Not on file    Minutes per session: Not on file  . Stress: Not on file  Relationships  . Social connections:    Talks on phone: Not on file    Gets together: Not on file    Attends religious service: Not on file    Active member of club or organization: Not on file    Attends meetings of clubs or organizations: Not on file    Relationship status: Not  on file  . Intimate partner violence:    Fear of current or ex partner: Not on file    Emotionally abused: Not on file    Physically abused: Not on file    Forced sexual activity: Not on file  Other Topics Concern  . Not on file  Social History Narrative  . Not on file     Family History  Problem Relation Age of Onset  . Colon cancer Neg Hx   . Rectal cancer Neg Hx   . Stomach cancer Neg Hx     ROS: Not obtained secondary patient intubated  Physical Examination  Vitals:   03/03/2018 1732 03/01/2018 1734  BP:  (!) 55/28  Pulse: (!) 59 80  Resp:  (!) 23  SpO2:  100%   Body mass index is 23.13 kg/m.  General: Intubated sedated HENT: WNL, normocephalic Pulmonary intubated Cardiac:] Could not palpate pulses distally Abdomen: Abdomen is distended pressure currently being held Extremities: Legs appear mottled Neurologic: Could not assess  CBC    Component Value Date/Time   WBC 9.7 02/25/2018 1347   RBC 3.74 (L) 02/10/2018 1347   HGB 12.6 (L) 02/20/2018 1353   HGB 14.1 12/22/2017 1134   HCT 37.0 (L) 03/02/2018 1353   HCT 40.8 12/22/2017 1134   PLT 107 (L) 02/02/2018 1347   PLT 155 12/22/2017 1134   MCV 98.9 02/20/2018 1347   MCV 100 (H) 12/22/2017 1134   MCH 34.0 02/22/2018 1347   MCHC 34.3 03/02/2018 1347   RDW 12.4 02/25/2018 1347   RDW 13.7 12/22/2017 1134   LYMPHSABS 2.0 02/22/2018 1347   LYMPHSABS 1.5 12/22/2017 1134   MONOABS 1.3 (H) 02/10/2018 1347   EOSABS 0.1 02/05/2018 1347   EOSABS 0.1 12/22/2017 1134   BASOSABS 0.0 02/21/2018 1347   BASOSABS 0.0 12/22/2017 1134    BMET    Component Value Date/Time   NA 136 02/13/2018 1353   NA 137 12/22/2017 1134   K 3.5 03/02/2018 1353   CL 101 03/03/2018 1353   CO2 21 (L) 02/21/2018 1347   GLUCOSE 100 (H) 02/12/2018 1353   BUN 16 02/19/2018 1353   BUN 23 12/22/2017 1134   CREATININE 0.90 02/05/2018 1353   CALCIUM 9.1 02/10/2018 1347   GFRNONAA >60 02/19/2018 1347   GFRAA >60 02/22/2018 1347     COAGS: Lab Results  Component Value Date   INR 1.04 03/01/2018   INR 1.06 01/28/2018     Non-Invasive Vascular Imaging:    IMPRESSION: 1. Very large retroperitoneal hematoma on the right extending from the inferior pelvis to the liver. This extends across the midline to the left in the abdomen. This is displacing and compressing the urinary bladder and displacing the small bowel and colon. 2. Diffuse low density  of the blood relative to the arterial walls, compatible with anemia. 3. Stable small number of 2-3 mm nodules in both lungs. These do not require follow-up.   ASSESSMENT/PLAN: This is a 77 y.o. male with right retroperitoneal hematoma after administration of TPA and right groin access.  Still has sheath in place.  Hopefully this is secondary to access site complication but could be spontaneous given the ministration of TPA.  Will take to the operating room for reexploration of right groin and abdomen.   Deah Ottaway C. Donzetta Matters, MD [facility] Office: [phone] Pager: [phone]

## 2018-02-03 NOTE — Transfer of Care (Signed)
Immediate Anesthesia Transfer of Care Note  Patient: Jared Norton.  Procedure(s) Performed: GROIN EXPLORATION POSSIBLE  wound vac application (Right )  Patient Location: ICU  Anesthesia Type:General  Level of Consciousness: Patient remains intubated per anesthesia plan  Airway & Oxygen Therapy: Patient remains intubated per anesthesia plan and Patient placed on Ventilator (see vital sign flow sheet for setting)  Post-op Assessment: Report given to RN and Post -op Vital signs reviewed and unstable, Anesthesiologist notified  Post vital signs: Reviewed and unstable  Last Vitals:  Vitals Value Taken Time  BP    Temp    Pulse    Resp 17 02/17/2018  8:45 PM  SpO2    Vitals shown include unvalidated device data.  Last Pain: There were no vitals filed for this visit.       Complications: No apparent anesthesia complications

## 2018-02-03 NOTE — Code Documentation (Signed)
77 yo male coming from home with complaints of sudden onset of trouble speaking. Pt had an aortic valve replacement two days ago. Pt went for a walk today and started to feel dizzy while walking in the heat. Pt was brought inside and wife called EMS. EMS evaluated patient and noted that he did not have any noted neuro deficits. Pt was being walked to the stretcher when they noted a sudden onset of aphasia. EMS called Code Stroke. Stroke Team met patient upon arrival. Initial NIHSS 7 due to severe global aphasia. CT Head completed. No hemorrhage noted. CTA completed. Awaiting family for tPA consent. Family arrived and consented. tPA given at 1417. 58.5 mg dose given - 5.9 mg bolus over 1 minutes and 52.6 mg given over 60 minutes. M3 occlusion noted on CTA. Pt to go to IR for diagnostic cerebral angiogram. Wife consented. Pt taken to IR and report given to [facility], Therapist, sports. Pt to be admitted to [facility].

## 2018-02-03 NOTE — Anesthesia Preprocedure Evaluation (Addendum)
Anesthesia Evaluation   Patient unresponsive    Reviewed: Allergy & Precautions, H&P , Patient's Chart, lab work & pertinent test results, Unable to perform ROS - Chart review only  History of Anesthesia Complications (+) PONV and history of anesthetic complications  Airway Mallampati: Intubated       Dental   Pulmonary neg pulmonary ROS,     + decreased breath sounds      Cardiovascular + dysrhythmias + Valvular Problems/Murmurs AS  Rhythm:Regular Rate:Normal  S/P TAVR on 10/1. Requiring pacer   Neuro/Psych CVA, Residual Symptoms    GI/Hepatic negative GI ROS, Neg liver ROS,   Endo/Other  negative endocrine ROS  Renal/GU negative Renal ROS  negative genitourinary   Musculoskeletal negative musculoskeletal ROS (+)   Abdominal Normal abdominal exam  (+)   Peds  Hematology  (+) anemia ,   Anesthesia Other Findings   Reproductive/Obstetrics                           Anesthesia Physical  Anesthesia Plan  ASA: IV and emergent  Anesthesia Plan: General   Post-op Pain Management:    Induction: Intravenous  PONV Risk Score and Plan: 2 and Treatment may vary due to age or medical condition  Airway Management Planned: Oral ETT  Additional Equipment: Arterial line  Intra-op Plan:   Post-operative Plan: Post-operative intubation/ventilation  Informed Consent:   Plan Discussed with: CRNA and Anesthesiologist  Anesthesia Plan Comments:         Anesthesia Quick Evaluation

## 2018-02-03 NOTE — H&P (Addendum)
Neurology history and physical  Reason for Consult: Code stroke Referring Physician: ED MD  CC: Aphasia code stroke  History is obtained from: EMS  HPI: Jared Norton. is a 77 y.o. male aortic stenosis, status post TAVR.  Who called EMS secondary to syncopal episode after taking a walk outside.  He originally called EMS after calling cardiology office due to abnormal chest sensation and syncopal episode in order to obtain a twelve-lead recording however, upon getting to the house, EMS noticed that patient was having some difficulty expressing himself and having expressive aphasia.  At that moment code stroke was called and patient was brought in as a code stroke.  At the bridge patient was noted to have right nasolabial fold decrease along with dense receptive aphasia.  He did not have any weakness in his arms or legs bilaterally and he had no visual deficits.  Patient recently had the transcatheter aortic valve replacement 2 days ago and is on Plavix and aspirin.  Patient was immediately brought to the CT to evaluate for any bleed.   TAVR was done by Dr. Roxy Manns and Dr. Burt Knack on 02/01/2018--Dr. Burt Knack was contacted and discussion was had between Dr. Leonel Ramsay and Dr. Burt Knack at which time Dr. Burt Knack stated that given the severity of his deficits the potential benefit of restoring communication outweigh the risks.  Patient was then immediately brought over to IR where the vessel was seen to have recannalized with tpa.   LKW: 4481 on 02/06/2018 tpa given?:  Yes Premorbid modified Rankin scale (mRS): 0 NIH stroke scale 7   ROS: A 14 point ROS was limited due to aphasia  Past Medical History:  Diagnosis Date  . Cataract, bilateral   . Complication of anesthesia    ALLERGY  TO ETHER  . 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 Severe aortic stenosis      Family  History  Problem Relation Age of Onset  . Colon cancer Neg Hx   . Rectal cancer Neg Hx   . Stomach cancer Neg Hx      Social History:   reports that he has never smoked. He has never used smokeless tobacco. He reports that he drinks about 3.0 standard drinks of alcohol per week. He reports that he does not use drugs.  Medications  Current Facility-Administered Medications:  .  aspirin 325 MG tablet, , , ,  .  eptifibatide (INTEGRILIN) 20 MG/10ML injection, , , ,  .  lidocaine (XYLOCAINE) 1 % (with pres) injection, , , ,  .  nitroGLYCERIN 100 mcg/mL intra-arterial injection, , , ,  .  ticagrelor (BRILINTA) 90 MG tablet, , , ,  .  tirofiban (AGGRASTAT) 5-0.9 MG/100ML-% injection, , , ,  .   stroke: mapping our early stages of recovery book, , Does not apply, Once, Marliss Coots, PA-C .  alteplase (ACTIVASE) 1 mg/mL infusion 58.5 mg, 0.9 mg/kg, Intravenous, Once, Last Rate: 58.5 mL/hr at 02/02/2018 1417, 58.5 mg at 02/09/2018 1417 **FOLLOWED BY** 0.9 %  sodium chloride infusion, 50 mL, Intravenous, Once, Greta Doom, MD .  0.9 %  sodium chloride infusion, , Intravenous, Continuous, Marliss Coots, PA-C .  acetaminophen (TYLENOL) tablet 650 mg, 650 mg, Oral, Q4H PRN **OR** acetaminophen (TYLENOL) solution 650 mg, 650 mg, Per Tube, Q4H PRN **OR** acetaminophen (TYLENOL) suppository 650 mg, 650 mg, Rectal, Q4H PRN, Etta Quill  R, PA-C .  famotidine (PEPCID) IVPB 20 mg premix, 20 mg, Intravenous, Q12H, Marliss Coots, PA-C .  iopamidol (ISOVUE-300) 61 % injection, , , ,  .  labetalol (NORMODYNE,TRANDATE) injection 20 mg, 20 mg, Intravenous, Once **AND** nicardipine (CARDENE) 20mg  in 0.86% saline 233ml IV infusion (0.1 mg/ml), 0-15 mg/hr, Intravenous, Continuous, Smith, David R, PA-C .  lidocaine (XYLOCAINE) 1 % (with pres) injection, , , ,  .  senna-docusate (Senokot-S) tablet 1 tablet, 1 tablet, Oral, QHS PRN, Marliss Coots, PA-C  Current Outpatient Medications:  .  aspirin EC 81  MG tablet, Take 1 tablet (81 mg total) by mouth daily., Disp: , Rfl:  .  clopidogrel (PLAVIX) 75 MG tablet, Take 1 tablet (75 mg total) by mouth daily with breakfast., Disp: 90 tablet, Rfl: 1 .  Coenzyme Q10 (COQ10) 100 MG CAPS, Take 100 mg by mouth daily., Disp: , Rfl:  .  hydrocortisone 2.5 % cream, Apply 1 application topically 2 (two) times daily as needed (for itching). , Disp: , Rfl: 0 .  metroNIDAZOLE (METROGEL) 0.75 % gel, Apply 1 application topically 2 (two) times daily as needed (for rosacea). , Disp: , Rfl: 2 .  Multiple Vitamins-Minerals (MULTIVITAMIN WITH MINERALS) tablet, Take 1 tablet by mouth daily., Disp: , Rfl:  .  Omega-3 Fatty Acids (OMEGA 3 PO), Take 1 capsule by mouth daily. , Disp: , Rfl:    Exam: Current vital signs: BP (!) 160/83   Pulse 87   Resp 17   Wt 65 kg   SpO2 100%   BMI 23.13 kg/m  Vital signs in last 24 hours: Pulse Rate:  [69-87] 87 (10/03 1404) Resp:  [16-17] 17 (10/03 1404) BP: (148-160)/(60-83) 160/83 (10/03 1430) SpO2:  [100 %] 100 % (10/03 1404) Weight:  [65 kg] 65 kg (10/03 1300)  GENERAL: Awake, alert in NAD Eyes- no scleral injection or erythema ENT: - Normocephalic and atraumatic, dry mm,  LUNGS - Clear to auscultation bilaterally with no wheezes CV - RRR, pulses present bilaterally in upper and lower extremities ABDOMEN - Soft, nontender, nondistended with normoactive BS Ext: warm, well perfused, intact peripheral pulses,  Skin-WDI with normal turgor Psych--abnormal affect, anxiety  NEURO:  Mental Status: A speech is clear.   receptive aphasia, he does not reliably follow commands, and his speech, though formed words, does not convey what he is trying to convey Cranial Nerves: PERRL 2 mm/brisk. EOMI, visual fields full, right NL facial asymmetry, facial sensation intact, hearing intact, tongue Motor: 5/5 Tone: is normal and bulk is normal Sensation- Intact to light touch bilaterally Coordination: FTN intact bilaterally, no ataxia  in BLE. Gait- deferred  Labs I have reviewed labs in epic and the results pertinent to this consultation are:   CBC    Component Value Date/Time   WBC 9.7 02/20/2018 1347   RBC 3.74 (L) 02/06/2018 1347   HGB 12.6 (L) 02/21/2018 1353   HGB 14.1 12/22/2017 1134   HCT 37.0 (L) 02/16/2018 1353   HCT 40.8 12/22/2017 1134   PLT 107 (L) 02/10/2018 1347   PLT 155 12/22/2017 1134   MCV 98.9 02/07/2018 1347   MCV 100 (H) 12/22/2017 1134   MCH 34.0 02/17/2018 1347   MCHC 34.3 02/01/2018 1347   RDW 12.4 02/14/2018 1347   RDW 13.7 12/22/2017 1134   LYMPHSABS 2.0 02/03/2018 1347   LYMPHSABS 1.5 12/22/2017 1134   MONOABS 1.3 (H) 02/03/2018 1347   EOSABS 0.1 02/03/2018 1347   EOSABS 0.1 12/22/2017 1134  BASOSABS 0.0 03/03/2018 1347   BASOSABS 0.0 12/22/2017 1134    CMP     Component Value Date/Time   NA 136 02/25/2018 1353   NA 137 12/22/2017 1134   K 3.5 02/23/2018 1353   CL 101 02/19/2018 1353   CO2 21 (L) 02/01/2018 1347   GLUCOSE 100 (H) 02/28/2018 1353   BUN 16 02/01/2018 1353   BUN 23 12/22/2017 1134   CREATININE 0.90 02/01/2018 1353   CALCIUM 9.1 02/06/2018 1347   PROT 6.4 (L) 02/02/2018 1347   ALBUMIN 4.0 02/01/2018 1347   AST 39 02/05/2018 1347   ALT 25 03/03/2018 1347   ALKPHOS 35 (L) 03/01/2018 1347   BILITOT 1.1 02/01/2018 1347   GFRNONAA >60 02/23/2018 1347   GFRAA >60 02/02/2018 1347    Lipid Panel  No results found for: CHOL, TRIG, HDL, CHOLHDL, VLDL, LDLCALC, LDLDIRECT   Imaging I have reviewed the images obtained:  CT-scan of the brain: IMPRESSION: 1. No acute abnormality. 2. ASPECTS is 10 3. The study was reviewed with Dr. Leonel Ramsay  CTA of head and neck: IMPRESSION: 1. Mild atherosclerotic disease at the carotid bifurcation. No significant carotid or vertebral artery stenosis in the neck. 2. Decreased enhancement and decreased caliber of left M3 branch adjacent to the insula which likely represents slow flow from partially occlusive  thrombus given the symptoms. 3. Images reviewed with Dr. Leonel Ramsay at approximately 1410 hours    Etta Quill PA-C [facility] [phone]  M-F  (9:00 am- 5:00 PM)  02/01/2018, 2:42 PM   I have seen the patient and reviewed the above note.  He had a dense aphasia that started after EMS arrival after they had been called for presyncope.  He had a right facial droop and dense receptive aphasia. Even though it was an M3 branch, there was question about whether it would be amenable to intervention and therefore he was taken for an angiogram for further evaluation.  By the time of angiogram, the clot had cleared, recanalized with IV TPA.  He was therefore not intubated and was brought up to the neuro ICU neurologically intact with no aphasia.  Unfortunately, once brought up to the neuro ICU he developed groin hematomas and severe bradycardia.  Both critical care and cardiology were consulted.  He was taken emergently to the Cath Lab for transcutaneous pacing.  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  TPA was given.  I do wonder if increased vagal tone from compression from the groin hematomas played a role in his decompensation today.  Also, with hypertension, he will need serial H&H's   Recommendations: -Admit to the ICU - Frequent neuro checks as per orders - Telemetry - N.p.o. unless passes swallow screen - No antiplatelets at this time - CT head in the morning followed by MRI when stable - Appreciate cardiology and critical care assistance - CT abdomen pelvis to evaluate for retroperitoneal bleeding -Type and screen  This patient is critically ill and at significant risk of neurological worsening, death and care requires constant monitoring of vital signs, hemodynamics,respiratory and cardiac monitoring, neurological assessment, discussion with family, other specialists and medical  decision making of high complexity. I spent 80 minutes of neurocritical care time in the care of  this patient, this is time independent of the PA who documented above.  Roland Rack, MD [facility] [phone]  If 7pm- 7am, please page neurology on call as listed in  [facility]. 02/21/2018  6:14 PM

## 2018-02-03 NOTE — ED Notes (Signed)
Patient transported to IR 

## 2018-02-03 NOTE — Sedation Documentation (Signed)
Dr. Estanislado Pandy aware patient does not have foley catheter in place. No new orders obtained. Per Stroke nurse, patient used urinal in ED prior to coming to IR.

## 2018-02-03 NOTE — ED Triage Notes (Signed)
Per [facility] pt called out after being outside walking became very hot and dizzy. While ems was attempting to walk pt to the couch to lie down he became dysphagic and could not recognize items at 13:29. Code Stroke called and pt transported.

## 2018-02-03 NOTE — Anesthesia Procedure Notes (Addendum)
Arterial Line Insertion Start/End10/10/2017 7:55 PM, 02/03/2018 7:59 PM Performed by: Duane Boston, MD, anesthesiologist  Preanesthetic checklist: patient identified, IV checked, monitors and equipment checked and pre-op evaluation Left, radial was placed Catheter size: 20 G Hand hygiene performed  and maximum sterile barriers used   Procedure performed using ultrasound guided technique. Ultrasound Notes:needle tip was noted to be adjacent to the nerve/plexus identified Following insertion, dressing applied and Biopatch. Additional procedure comments: Images not printed.

## 2018-02-03 NOTE — Progress Notes (Addendum)
1620: Pt reports sudden onset of light-headedness and nausea, groin sites checked bilaterally, R site with sheath intact, L side appx 3 inch high palpable hematoma, pressure held immediately starting at 1620.  Simultaneously, BP dropped suddenly from 130s (per both sheath transducer AND arm cuff) to low 80s and HR dropped down into the 30s and high 20s.  This pattern of events occurred 2 more times, with HR 28 at the lowest.  1 mg Atropine given with good response (HR 70s, BP 110s).  CCM and Cards called, at bedside.  IR called, instructed to continue holding pressure to L hematoma site and to hold pressure to R groin if it increased in size, which it did not, no oozing to R sheath dressing noted.   1650: 1 mg Atropine given, 2 L NS bolus running 1654: External pacing initiated at 70 bpm 1656:Dobutamine gtt started 61mcg, pt emergently transferred to cath lab for temp pacing wire  Pt intubated in cath lab, 150 mg Fentanyl, 2 mg Versed, 20 mg Amidate, and 50 mg Rocuronium given prior to intubation

## 2018-02-03 NOTE — Telephone Encounter (Signed)
Attempted to make initial call to patient in regards to Cardiac Rehab - lm on vm °

## 2018-02-03 NOTE — Progress Notes (Signed)
RT note- Patient transferred to CT and then to [place], Dr. Nelda Marseille at bedside, rate increased to 30.

## 2018-02-03 NOTE — Progress Notes (Signed)
PT Cancellation Note  Patient Details Name: Jared Norton. MRN: [id number] DOB: [birth year]   Cancelled Treatment:    Reason Eval/Treat Not Completed: Active bedrest order. Please remove when pt appropriate for PT evaluation.  Mabeline Caras, PT, DPT Acute Rehabilitation Services  Pager [phone] Office [place] 02/18/2018, 3:09 PM

## 2018-02-03 NOTE — Progress Notes (Signed)
49 y M s/p tPA, developed retroperitoneal hematoma. Back from OR after exploration of femoral artery. Continuos to require high dose of Levophed for hypotension despite receiving 12 units of PRBCs. Also pupils fixed and dilated, 2hrs after paralytics for intubation.  DIC panel ordered and after discussing with ICU team, Vascular surgeon- will administer TXA, cryo, FFP. TXA ordered prior to OR, held at request of vascular surgery until bleeding vessel could be repaired.  CT Head as soon as possible and able to be transferred, concern for intracerebral hemorrhage.

## 2018-02-03 NOTE — Progress Notes (Signed)
Attending Note:  77 year old male s/p CVA and tPA who presents to PCCM with bradycardia and hypotension that resulted in him having respiratory failure.  Patient evidently had a hematoma at the site of the interventions and likely had a vagal response.  On exam, he was alert and interactive but became more combative once on the cath table with clear lungs.  I reviewed CXR myself, no acute disease noted.  Discussed with neuro and cards.  Will intubate patient and paralyze.  Temp pacer to be placed.  Full vent support.  H/H q6.  T&S.  Transfuse if remains hypotensive after stabilization of his cardiac rhythm is more stable.  CT of the head, abdomen and pelvis ordered to evaluate for bleeding.  PCCM will continue to follow.  The patient is critically ill with multiple organ systems failure and requires high complexity decision making for assessment and support, frequent evaluation and titration of therapies, application of advanced monitoring technologies and extensive interpretation of multiple databases.   Critical Care Time devoted to patient care services described in this note is  36  Minutes. This time reflects time of care of this signee Dr Jennet Maduro. This critical care time does not reflect procedure time, or teaching time or supervisory time of PA/NP/Med student/Med Resident etc but could involve care discussion time.  Rush Farmer, M.D. [facility] Pulmonary/Critical Care Medicine. Pager: [phone]. After hours pager: [phone].

## 2018-02-03 NOTE — Telephone Encounter (Signed)
Leanne from [place] called to report the patient had a triggered event today at 12:27. Leanne spoke with the patient's wife and she said the patent nearly fainted. The paramedics were there when she called he started slurring his speech. They took him to the ED. She said the underlying rhythm at the time was SR with IVCD.   Informed Leanne the patient's wife called Cardiology at the time of syncopal spell and he was instructed to call 911.

## 2018-02-03 NOTE — Progress Notes (Signed)
Chaplain called by 2H to be with wife of patient. Pt was being supported by nurse when chaplain arrived in [facility] waiting room. Chaplain offered emotional and spiritual support in waiting area for 30 minutes and then went into room as pt was being made ready for emergency surgery. Chaplain prayed for pt before he was taken into operating room and stayed with wife until a friend arrived who could be with her through surgery. Will follow. Rev. Tamsen Snider [phone]

## 2018-02-03 NOTE — Progress Notes (Addendum)
[place] Progress Note Patient Name: Jared Norton. DOB: [birth year] MRN: [id number]   Date of Service  02/09/2018  HPI/Events of Note  Hypotension - BP = 75/58 presently on Norepinephrine and Dopamine IV infusions at ceilings. Last LVEF = 65% to 70% and Hgb = 12.6.  eICU Interventions  Will order:  1. Bolus with 0.9 NaCl 1 liter IV over 1 hour now. 2. Increase ceiling on Norepinephrine IV infusion to 80 mcg/min. 3. ABG STAT. 4. Ground team requested to evaluate at bedside.      Intervention Category Major Interventions: Hypotension - evaluation and management  Tico Crotteau [place] 02/05/2018, 9:12 PM

## 2018-02-04 ENCOUNTER — Inpatient Hospital Stay (HOSPITAL_COMMUNITY): Payer: Medicare Other

## 2018-02-04 ENCOUNTER — Encounter: Payer: Self-pay | Admitting: Thoracic Surgery (Cardiothoracic Vascular Surgery)

## 2018-02-04 ENCOUNTER — Encounter (HOSPITAL_COMMUNITY): Payer: Self-pay | Admitting: Cardiovascular Disease

## 2018-02-04 DIAGNOSIS — R578 Other shock: Secondary | ICD-10-CM

## 2018-02-04 LAB — COMPREHENSIVE METABOLIC PANEL
ALK PHOS: 18 U/L — AB (ref 38–126)
ALT: 102 U/L — ABNORMAL HIGH (ref 0–44)
AST: 136 U/L — ABNORMAL HIGH (ref 15–41)
Albumin: 1.9 g/dL — ABNORMAL LOW (ref 3.5–5.0)
BUN: 15 mg/dL (ref 8–23)
CALCIUM: 6.7 mg/dL — AB (ref 8.9–10.3)
Chloride: 117 mmol/L — ABNORMAL HIGH (ref 98–111)
Creatinine, Ser: 1.2 mg/dL (ref 0.61–1.24)
GFR calc non Af Amer: 57 mL/min — ABNORMAL LOW (ref >60)
GLUCOSE: 333 mg/dL — AB (ref 70–99)
Potassium: 4 mmol/L (ref 3.5–5.1)
SODIUM: 136 mmol/L (ref 135–145)
Total Bilirubin: 1.2 mg/dL (ref 0.3–1.2)
Total Protein: <3 g/dL — ABNORMAL LOW (ref 6.5–8.1)

## 2018-02-04 LAB — TYPE AND SCREEN
ABO/RH(D): O POS
Antibody Screen: NEGATIVE
UNIT DIVISION: 0
UNIT DIVISION: 0
UNIT DIVISION: 0
UNIT DIVISION: 0
UNIT DIVISION: 0
Unit division: 0
Unit division: 0
Unit division: 0
Unit division: 0
Unit division: 0

## 2018-02-04 LAB — PREPARE CRYOPRECIPITATE
UNIT DIVISION: 0
Unit division: 0

## 2018-02-04 LAB — BPAM FFP
BLOOD PRODUCT EXPIRATION DATE: 201910072359
Blood Product Expiration Date: 201910072359
Blood Product Expiration Date: 201910072359
Blood Product Expiration Date: 201910072359
ISSUE DATE / TIME: 201910032138
ISSUE DATE / TIME: 201910032138
ISSUE DATE / TIME: 201910032138
ISSUE DATE / TIME: 201910032138
UNIT TYPE AND RH: 9500
Unit Type and Rh: 5100
Unit Type and Rh: 5100
Unit Type and Rh: 5100

## 2018-02-04 LAB — BPAM RBC
BLOOD PRODUCT EXPIRATION DATE: 201910302359
BLOOD PRODUCT EXPIRATION DATE: 201910302359
BLOOD PRODUCT EXPIRATION DATE: 201910302359
BLOOD PRODUCT EXPIRATION DATE: 201910312359
BLOOD PRODUCT EXPIRATION DATE: 201911032359
BLOOD PRODUCT EXPIRATION DATE: 201911032359
Blood Product Expiration Date: 201910302359
Blood Product Expiration Date: 201910302359
Blood Product Expiration Date: 201911032359
Blood Product Expiration Date: 201911032359
ISSUE DATE / TIME: 201910031741
ISSUE DATE / TIME: 201910031741
ISSUE DATE / TIME: 201910031829
ISSUE DATE / TIME: 201910031917
ISSUE DATE / TIME: 201910040022
ISSUE DATE / TIME: 201910040022
ISSUE DATE / TIME: 201910031829
ISSUE DATE / TIME: 201910031917
UNIT TYPE AND RH: 5100
UNIT TYPE AND RH: 5100
UNIT TYPE AND RH: 9500
Unit Type and Rh: 5100
Unit Type and Rh: 5100
Unit Type and Rh: 5100
Unit Type and Rh: 5100
Unit Type and Rh: 5100
Unit Type and Rh: 5100
Unit Type and Rh: 9500

## 2018-02-04 LAB — PREPARE FRESH FROZEN PLASMA
UNIT DIVISION: 0
UNIT DIVISION: 0
Unit division: 0
Unit division: 0

## 2018-02-04 LAB — PREPARE PLATELET PHERESIS
UNIT DIVISION: 0
UNIT DIVISION: 0

## 2018-02-04 LAB — CBC
HCT: 11.8 % — ABNORMAL LOW (ref 39.0–52.0)
Hemoglobin: 3.8 g/dL — CL (ref 13.0–17.0)
MCH: 30.2 pg (ref 26.0–34.0)
MCHC: 32.2 g/dL (ref 30.0–36.0)
MCV: 93.7 fL (ref 78.0–100.0)
PLATELETS: 11 10*3/uL — AB (ref 150–400)
RBC: 1.26 MIL/uL — ABNORMAL LOW (ref 4.22–5.81)
RDW: 15.5 % (ref 11.5–15.5)
WBC: 6.5 10*3/uL (ref 4.0–10.5)

## 2018-02-04 LAB — POCT I-STAT 7, (LYTES, BLD GAS, ICA,H+H)
Acid-base deficit: 19 mmol/L — ABNORMAL HIGH (ref 0.0–2.0)
BICARBONATE: 8.5 mmol/L — AB (ref 20.0–28.0)
Calcium, Ion: 0.8 mmol/L — CL (ref 1.15–1.40)
HEMATOCRIT: 24 % — AB (ref 39.0–52.0)
HEMOGLOBIN: 8.2 g/dL — AB (ref 13.0–17.0)
O2 SAT: 100 %
PCO2 ART: 24.7 mmHg — AB (ref 32.0–48.0)
PH ART: 7.134 — AB (ref 7.350–7.450)
PO2 ART: 476 mmHg — AB (ref 83.0–108.0)
Patient temperature: 35
Potassium: 4.6 mmol/L (ref 3.5–5.1)
Sodium: 142 mmol/L (ref 135–145)
TCO2: 9 mmol/L — AB (ref 22–32)

## 2018-02-04 LAB — BPAM CRYOPRECIPITATE
Blood Product Expiration Date: 201910040110
Blood Product Expiration Date: 201910040110
ISSUE DATE / TIME: 201910040022
ISSUE DATE / TIME: 201910032138
UNIT TYPE AND RH: 5100
UNIT TYPE AND RH: 5100

## 2018-02-04 LAB — BPAM PLATELET PHERESIS
BLOOD PRODUCT EXPIRATION DATE: 201910042204
Blood Product Expiration Date: 201910050004
ISSUE DATE / TIME: 201910032306
ISSUE DATE / TIME: 201910040022
UNIT TYPE AND RH: 6200
Unit Type and Rh: 5100

## 2018-02-04 LAB — TROPONIN I: TROPONIN I: 3.6 ng/mL — AB (ref <0.03)

## 2018-02-04 LAB — PREPARE RBC (CROSSMATCH)

## 2018-02-04 MED ORDER — MORPHINE SULFATE (PF) 2 MG/ML IV SOLN
1.0000 mg | Freq: Once | INTRAVENOUS | Status: AC
  Administered 2018-02-04: 1 mg via INTRAVENOUS

## 2018-02-04 MED ORDER — MORPHINE SULFATE (PF) 2 MG/ML IV SOLN
INTRAVENOUS | Status: AC
  Filled 2018-02-04: qty 1

## 2018-02-04 MED ORDER — SODIUM CHLORIDE 0.9% IV SOLUTION: Freq: Once | INTRAVENOUS | Status: DC

## 2018-02-05 LAB — HAPTOGLOBIN: Haptoglobin: 19 mg/dL — ABNORMAL LOW (ref 34–200)

## 2018-02-07 ENCOUNTER — Encounter (HOSPITAL_COMMUNITY): Payer: Self-pay | Admitting: Interventional Radiology

## 2018-02-11 NOTE — Anesthesia Postprocedure Evaluation (Addendum)
Anesthesia Post Note  Patient: Jared Norton.  Procedure(s) Performed: IR WITH ANESTHESIA (N/A )     Patient location during evaluation: ICU Anesthesia Type: MAC Level of consciousness: awake Pain management: pain level controlled Vital Signs Assessment: post-procedure vital signs reviewed and stable Respiratory status: spontaneous breathing Cardiovascular status: stable Postop Assessment: no apparent nausea or vomiting Anesthetic complications: no    Last Vitals:  Vitals:   02-21-18 0100 02/21/2018 0106  BP:    Pulse:    Resp: (!) 0 (!) 0  Temp:    SpO2:      Last Pain: There were no vitals filed for this visit. Pain Goal:                 Jaquavious Mercer JR,JOHN Zaharah Amir

## 2018-02-14 ENCOUNTER — Ambulatory Visit: Payer: Medicare Other | Admitting: Physician Assistant

## 2018-02-23 ENCOUNTER — Encounter: Payer: Self-pay | Admitting: Cardiovascular Disease

## 2018-03-04 NOTE — Progress Notes (Signed)
Patient was pronounced dead after Dr Gilford Raid, Rn Josemanuel Eakins Amo Kuffour and Rn [place] auscultated no breath and heart sounds for over one minute. Patient's family is at bedside. Chaplain with family

## 2018-03-04 NOTE — Progress Notes (Signed)
Chaplain followed up with family post surgery. Pt's wife, daughter and friend were in waiting area.  Doctor and nurse came to inform family that his situation had worsened and that there was little they could do. Chaplain provided prayer and emotional support bedside with wife and daughter. Tamsen Snider

## 2018-03-04 NOTE — Progress Notes (Signed)
All abnormal labs called to Dr Ronnette Juniper of [facility].

## 2018-03-04 NOTE — Significant Event (Addendum)
PCCM Progress Note WITHDRAWAL OF CARE   I, Dr Seward Carol have personally reviewed patient's available data, including medical history, events of note, physical examination and test results as part of my evaluation. I have discussed with other care providers such as Neurology, pharmacist, RN and [facility].  In addition,  I personally evaluated patient at 1235AM on 03-05-2018  GCS 3 unresponsive pupils blown no corneal Neurology at bedside - we had a discussion regarding recent labs and findings Despite aggressive reversal and massive transfusion protocol It appears pt is unresponsive. He has been off of sedation for some time now and still no neurological response there is concern for anoxic injury vs hemorrhagic conversion however he is not hemodynamically stable to go for [facility] w/o contrast He is currently on max doses of Levo ggt and Neo ggt and Epi @15  Bicarb ggt s/p Albumin and transfusion of multiple products including prbcs, ffps, plts and cryo. His Fibrinogen <60 Hgb 3 Hct 11.8 Bicarb is <7 and LA continues to trnd up most recent 7.7 His BP continues to drop despite our interventions.    Severe Hemorrhagic and Neurogenic Shock  I had a discussion with his wife and daughter regarding recent clinical findings and his poor prognosis despite our aggressive medical interventions. The lack of neurological response and poor exam is the most concerning factor for his wife. She expressed that she would not want to continue if he is not the same as he was. The decision to withdraw life sustaining measures was made and at 1245AM chaplain was at bedside with family prior to the withdrawal of care. I answered their questions and concerns.  Terminal extubation 1252AM March 05, 2018 Withdrawal of pressors 1254AM 03-05-18 PEA on Cardiac monitor Cessation of electrical activity 106AM Time of Death 106AM  The patient is critically ill with multiple organ systems failure and requires high complexity decision making  for assessment and support, frequent evaluation and titration of therapies, application of advanced monitoring technologies and extensive interpretation of multiple databases.   Critical Care Time devoted to patient care services described in this note is 35  Minutes. This time reflects time of care of this signee Dr Seward Carol. This critical care time does not reflect procedure time, or teaching time or supervisory time but could involve care discussion time   CC TIME:35 additional  minutes CODE STATUS:DNR WITHDRAWAL OF CARE- discontinue all pressors, terminally extubate FAMILY: wife and daughter at bedside  Dr. Seward Carol Pulmonary Critical Care Medicine  03-05-18 12:43 AM

## 2018-03-04 NOTE — Progress Notes (Signed)
105ml of sanguinous drainage from wound vac carnister. Abdomen is firm and tout. Informed Dr Donzetta Matters about status.

## 2018-03-04 NOTE — Progress Notes (Signed)
282ml of fentanyl wasted and flushed in  sink in the presence of RN Iris June.

## 2018-03-04 NOTE — Discharge Summary (Addendum)
Patient ID: Jared Norton [id number] 77 y.o. 08/15/1940  Admit date: 02/07/2018  Admission Diagnoses: Aphasia [R47.01] Acute CVA (cerebrovascular accident) [facility]) [I63.9]  Date of death: Feb 05, 2018   Cause of Death : Retroperitoneal Hemorrhage as complication of IV tpa for ischemic stroke  Hospital Course :  Jared Norton is a 76 year old gentleman with a recent trans-catheter aortic valve replacement 2 days prior to admission.  Following a walk outside he felt a general sense of malaise, but no focal neurological deficits.  Due to this feeling of weakness/dizziness/malaise he called 911.  Following EMS arrival he had a witnessed onset of a dense aphasia and right facial droop.   He had a right facial droop and dense receptive aphasia.   Though he had arterial puncture 2 days prior, it was not a compressible site and therefore not felt to be an absolute contraindication to TPA.  I discussed with his cardiologist and given the severity of his communication deficit I offered TPA.  I discussed with his wife the usual risks associated with IV TPA including intracranial hemorrhage and death and indicated that he was at increased risk due to the recent procedure of hemorrhage into the sites which could even be threatening.  I also indicated that without treatment I felt that he was likely to have severe, life altering difficulties with communication.  After this discussion, his wife agreed with proceeding with IV TPA and this was administered.  Even though it was an M3 branch, there was question about whether it would be amenable to intervention and therefore he was taken for an angiogram for further evaluation.  By the time of angiogram, the clot had cleared, recanalized with IV TPA.  His aphasia began to clear in the IR suite.   He was therefore not intubated and was brought up to the neuro ICU neurologically intact with no aphasia. Unfortunately, once brought up to the neuro ICU he developed groin  hematomas and severe bradycardia.  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  He subsequently developed severe bradycardia and hypotension.  Each time his bradycardia resolved, his blood pressure improved as measured by arterial line, but due to repeated episodes of bradycardia he was given atropine.  At this point it was unclear the extent of his hemorrhage and I suspected simply a groin hematoma with compression leading to repeated bradycardia and hypotension due to the bradycardia.  Cardiology and critical care arrived at bedside promptly.  Even prior to this, there have been discussion that the patient might need a pacemaker and therefore the bradycardia was felt to need to be addressed urgently.  A CT scan of his abdomen pelvis was ordered at this time, but due to his instability he was taken for a emergent transvenous pacer placement prior to getting a CT scan of the abdomen and pelvis.  He was intubated for the procedure.  During the procedure, he became severely hypotensive with documented systolics as low as 30.  He was transfused  and maintained on pressors.  His pressure did respond to these interventions.  He remained in a critical state and was taken for his CT abdomen and pelvis on completion of the pacemaker placement which demonstrated a massive retroperitoneal hematoma.  At this point vascular surgery was consulted and I discussed reversal of TPA with him.  Due to the fact that the half-life of TPA was relatively short and he was about to have direct visualization of the bleeding artery he favored using reversal intraoperatively only if he could not control the bleeding.  He was taken to the OR where it was found that one of the previous TAVR sites was the source of hemorrhage and this was controlled in  the OR.  Unfortunately postoperatively he had fixed and dilated pupils and remained unstable and in the setting after discussion with family the decision was made to stop aggressive care and the patient expired.   Jared Norton 02/07/2018  9:04 AM

## 2018-03-04 NOTE — Progress Notes (Signed)
Verified waste of fentanyl 252ml with Tish Frederickson, RN.

## 2018-03-04 NOTE — Progress Notes (Addendum)
[place] Progress Note Patient Name: Jared Norton. DOB: 02/08/1941 MRN: [id number]   Date of Service  03/05/2018  HPI/Events of Note  Retroperitoneal Hemorrhage -  Last Fibrinogen < 60. Hgb by iSTAT would not read.   eICU Interventions  Will order: 1.Transfuse  Cryoprecipitate 1 unit now. 2. Transfuse FFP 3 units now.  3. Transfuse 2 units PRBC now.  4. Ionized Calcium level now.  5. CBC with Platelets, PT/INR, PTT and Fibrinogen level at 3:30 AM.         Lysle Dingwall March 05, 2018, 12:15 AM

## 2018-03-04 NOTE — Procedures (Signed)
Extubation Procedure Note  Terminal extubated per MD order.   Patient Details:   Name: Jared Norton. DOB: 07/13/1940 MRN: [id number]   Airway Documentation:    Vent end date: 2018/02/17 Vent end time: 0052   Evaluation Terminal Extubation    Marissa Nestle Feb 17, 2018, 12:52 AM

## 2018-03-04 DEATH — deceased

## 2018-03-16 ENCOUNTER — Other Ambulatory Visit (HOSPITAL_COMMUNITY): Payer: Medicare Other

## 2018-03-16 ENCOUNTER — Ambulatory Visit: Payer: Medicare Other | Admitting: Physician Assistant

## 2019-06-25 IMAGING — XA IR CARO CERE HEAD/NECK UNILAT LEFT (MS)
1 series · 14 of 24 positions shown · IV contrast (IODINE)
Comparison: CT angiogram of the head and neck of 02/03/2018.

CLINICAL DATA: Acute onset of aphasia and confusion. Abnormal CT
angiogram of the head and neck.

EXAM:
IR ANGIO INTRA EXTRACRAN SEL COM CAROTID INNOMINATE UNI LEFT MOD SED
TECHNIQUE: Informed written consent was obtained from the patient after a
thorough discussion of the procedural risks, benefits and
alternatives. All questions were addressed. Maximal Sterile Barrier
Technique was utilized including caps, mask, sterile gowns, sterile
gloves, sterile drape, hand hygiene and skin antiseptic. A timeout
was performed prior to the initiation of the procedure.

[Series 300: dr. (person_name) · 14 of 72 slices shown]
[im 1/72]
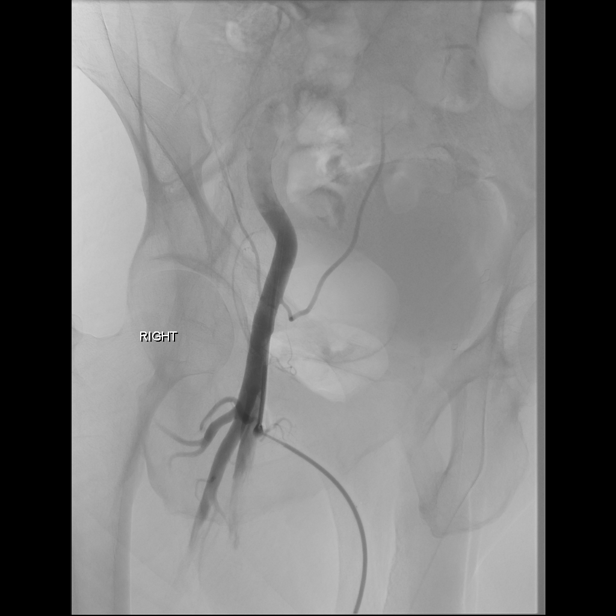
[im 7/72]
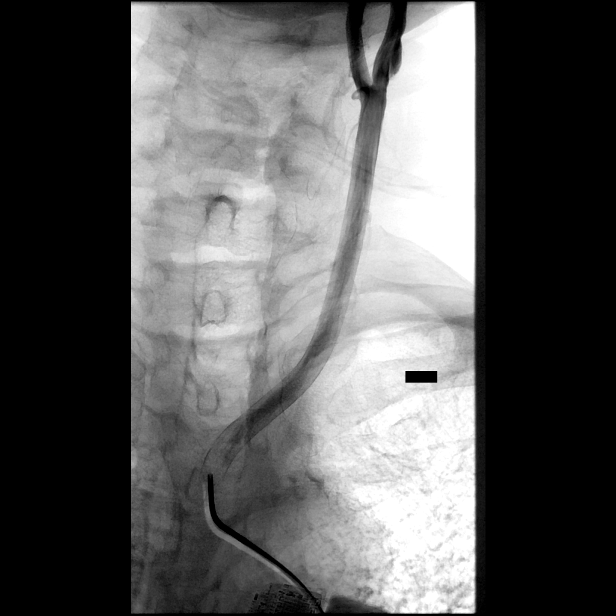
[im 13/72]
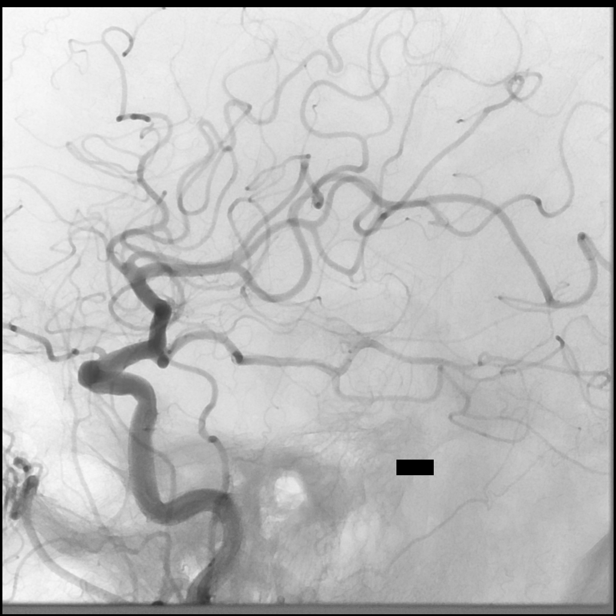
[im 19/72]
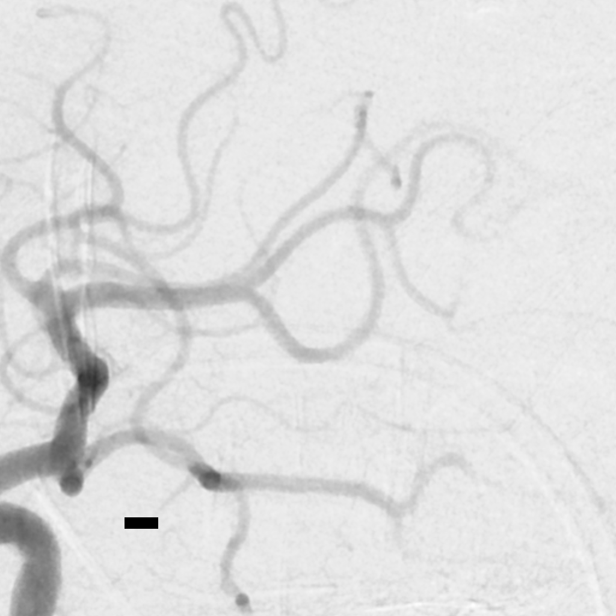
[im 22/72]
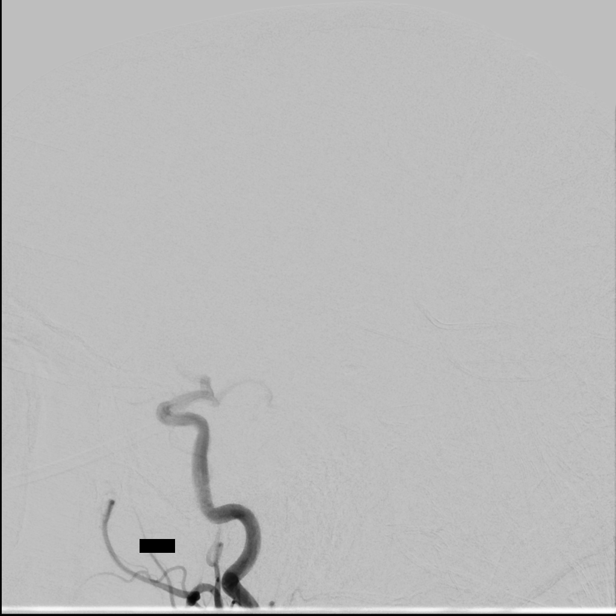
[im 28/72]
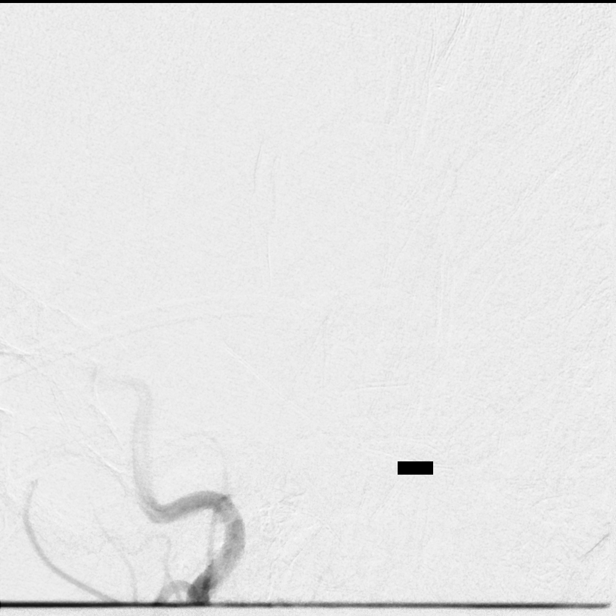
[im 34/72]
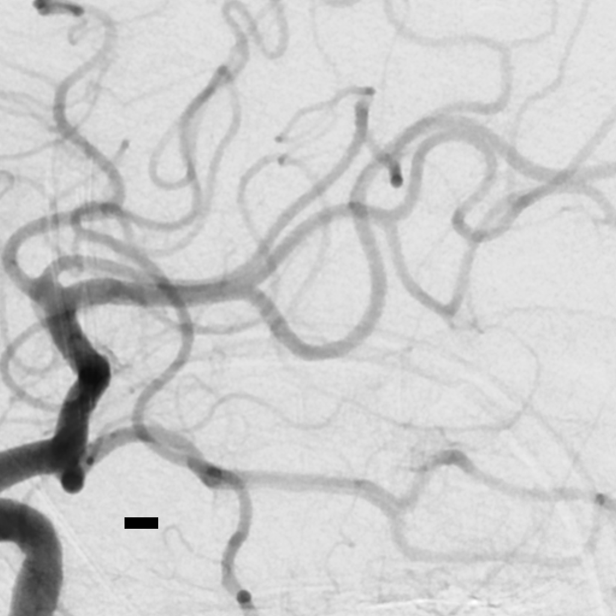
[im 38/72]
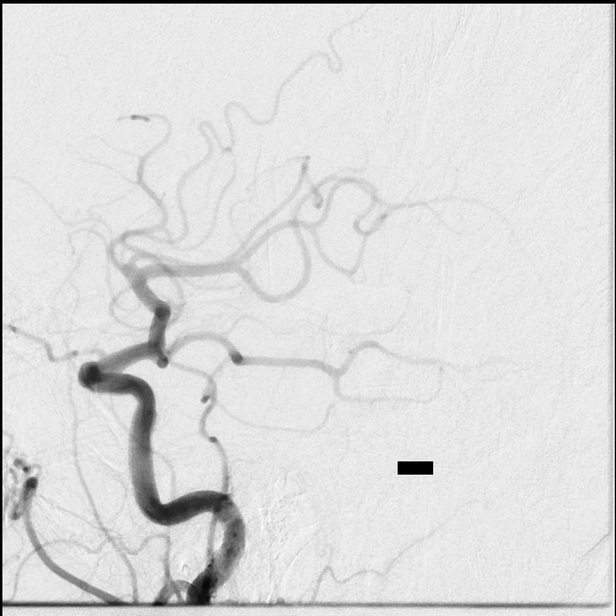
[im 44/72]
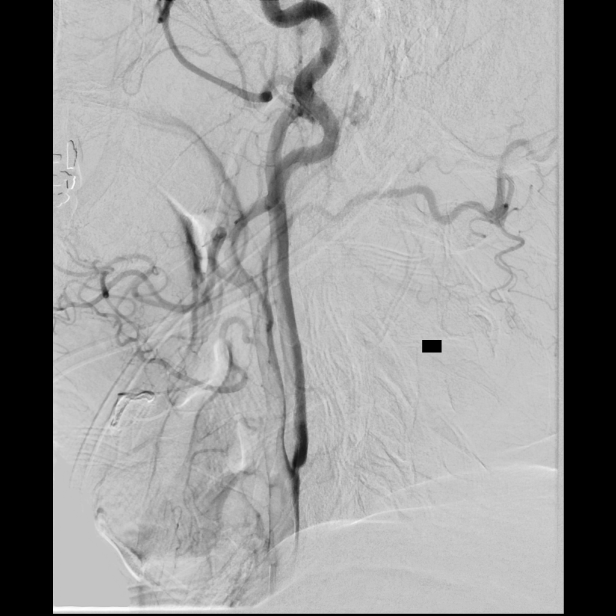
[im 50/72]
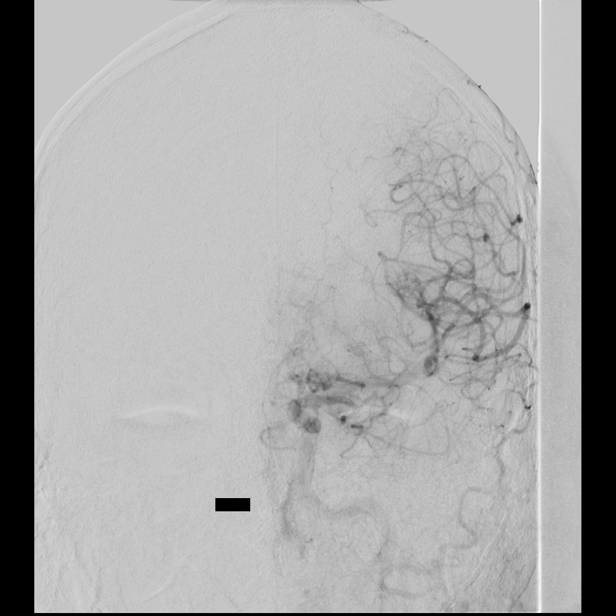
[im 56/72]
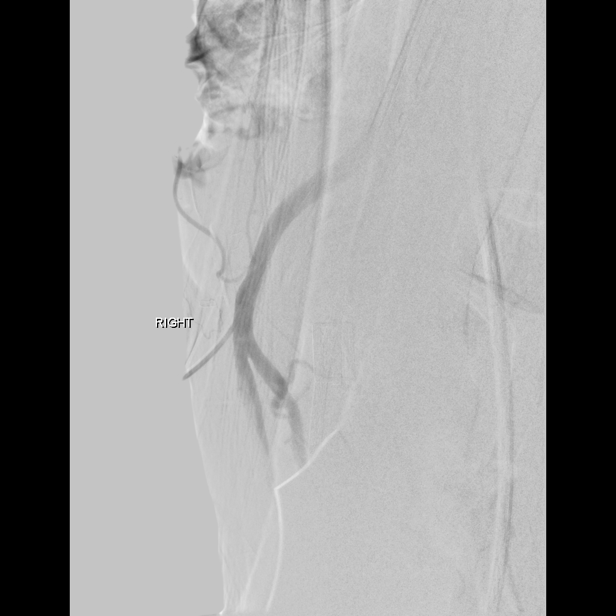
[im 59/72]
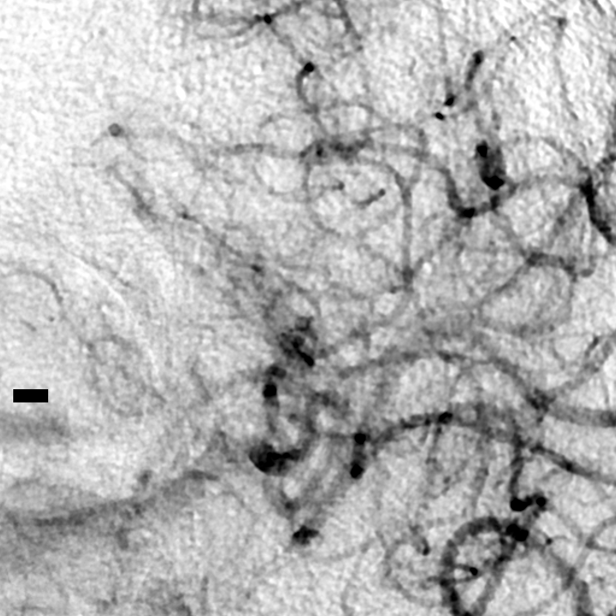
[im 65/72]
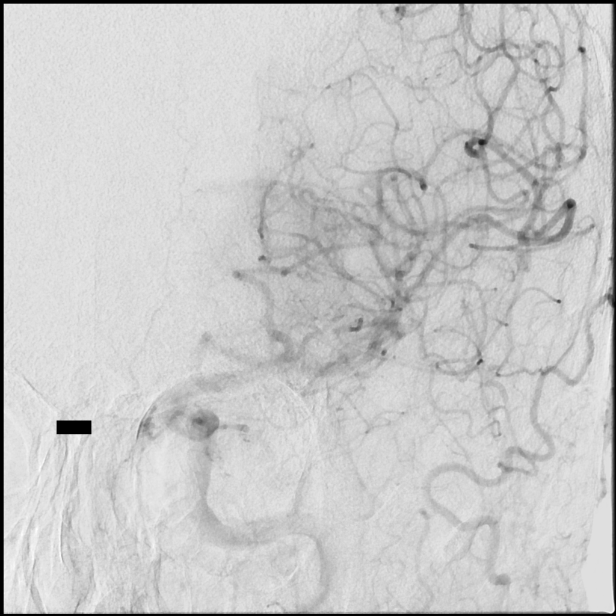
[im 72/72]
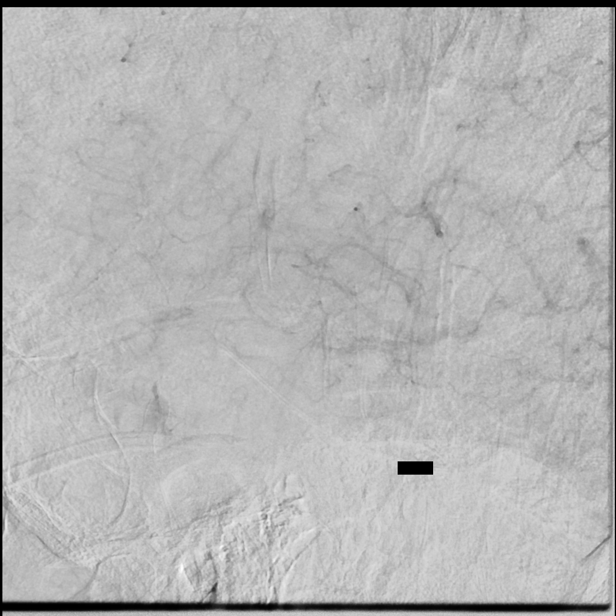

[14 of 24 positions shown; findings below may reference images not displayed]

MEDICATIONS:
Heparin 0 units IV; no antibiotic was administered within 1 hour of
the procedure.

ANESTHESIA/SEDATION:
Mac anesthesia as per the [REDACTED] at [REDACTED].

CONTRAST:  Isovue 300 approximately 30 mL

FLUOROSCOPY TIME:  Fluoroscopy Time: 4 minutes minutes 0 seconds
(390 mGy).

COMPLICATIONS:
None immediate.
The right groin was prepped and draped in the usual sterile fashion.
Thereafter using modified Seldinger technique, transfemoral access
into the right common femoral artery was obtained without
difficulty. Over a 0.035 inch guidewire, a 5 French Pinnacle sheath
was inserted. Through this, and also over 0.035 inch guidewire, a 5
French JB 1 catheter was advanced to the aortic arch region and
selectively positioned in the left common carotid artery.
FINDINGS: The left common carotid arteriogram demonstrates the left external
carotid artery and its major branches to be widely patent.

The left internal carotid artery has a smooth shallow plaque along
the posterior wall just posterior and inferior to the bulb.

No evidence of stenosis is noted, however.

The left internal carotid artery, otherwise, is seen to opacify to
the cranial skull base. Wide patency is seen of the petrous,
cavernous and the supraclinoid segments. A left posterior
communicating artery is seen opacifying the left posterior cerebral
artery distribution.

The left middle cerebral artery proximally and distally is seen to
opacify into the capillary and venous phases. No evidence of
intraluminal filling defects, occlusions, stenosis or dissection is
seen.

Nonvisualization of the left anterior cerebral artery A1 segment
could be developmental or pathologic.
IMPRESSION: Angiographically no evidence of occlusions, stenosis, or dissections
or of intraluminal filling defects.

Non opacification of the left anterior cerebral artery A1 segment
could be developmental, or pathologic related to intracranial
arteriosclerotic disease.

PLAN:
As per stroke team.

## 2019-06-25 IMAGING — CT CT ABD-PELV W/O CM
2 of 5 series · 13 of 46 positions shown, 15 images · non-contrast
Comparison: Portable chest dated 02/01/2018. Chest, abdomen and
pelvis CTA dated 12/30/2017.

CLINICAL DATA: Shock.

EXAM:
CT CHEST, ABDOMEN AND PELVIS WITHOUT CONTRAST
TECHNIQUE: Multidetector CT imaging of the chest, abdomen and pelvis was
performed following the standard protocol without IV contrast.

[Series 5: cap w/o 2.0 mm st · axial · non-contrast · 0.74mm/px · z∈[-863,-317]mm · 10 of 311 slices shown, 12 images]
[im 19/311  soft-tissue]
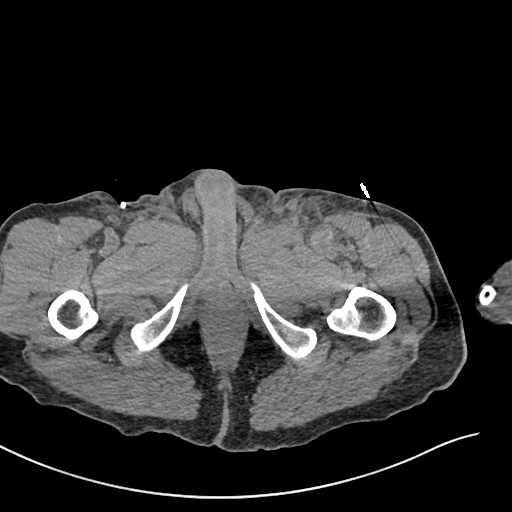
[im 19/311  bone]
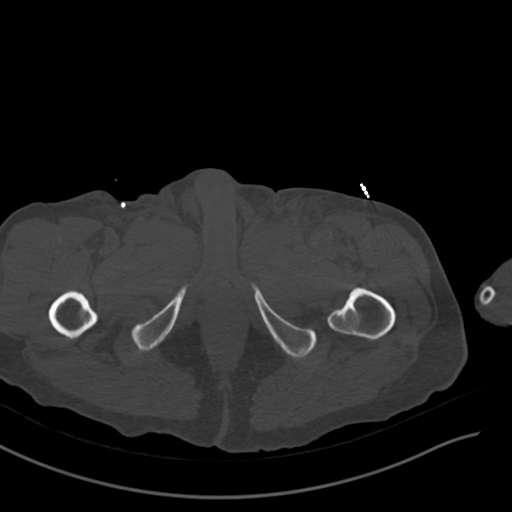
[im 55/311  soft-tissue]
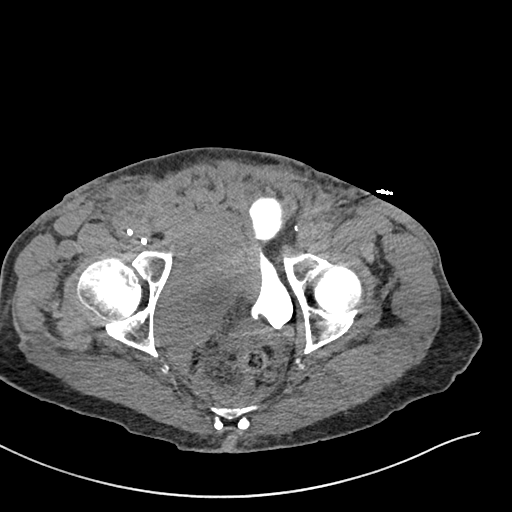
[im 92/311  soft-tissue]
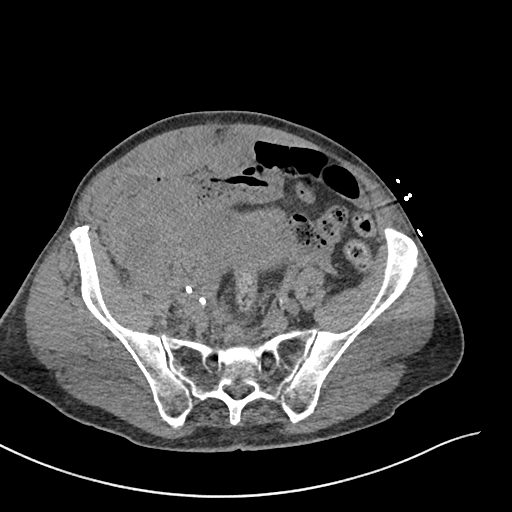
[im 110/311  soft-tissue]
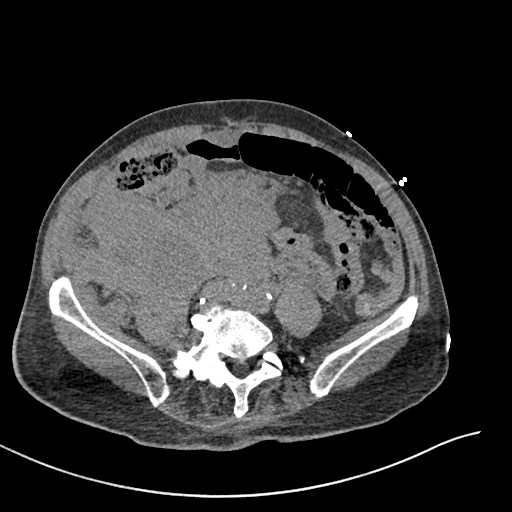
[im 146/311  soft-tissue]
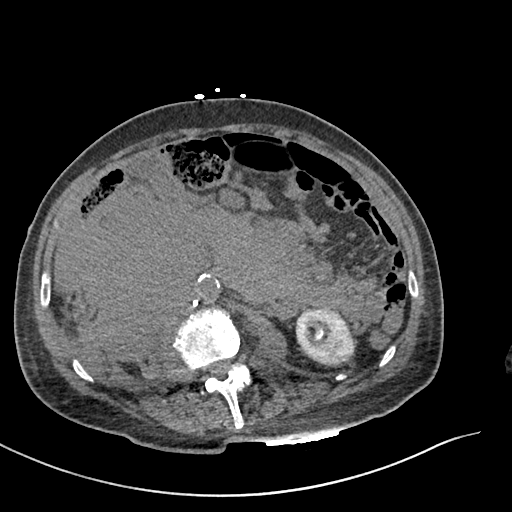
[im 165/311  soft-tissue]
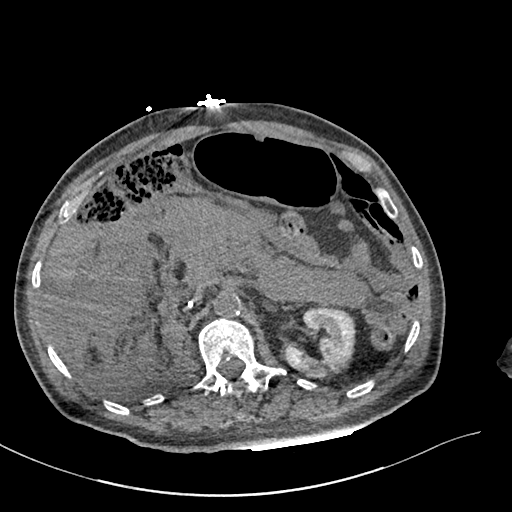
[im 201/311  soft-tissue]
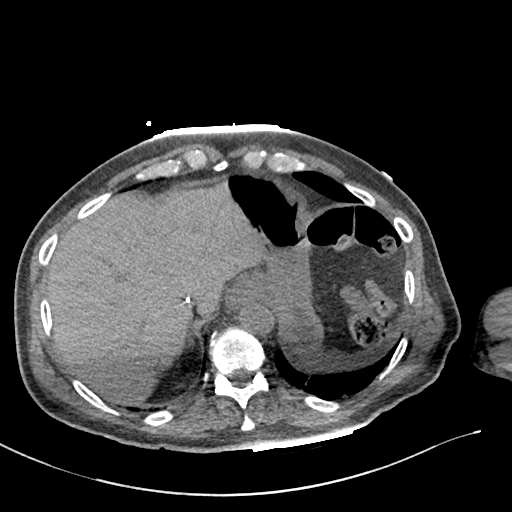
[im 238/311  soft-tissue]
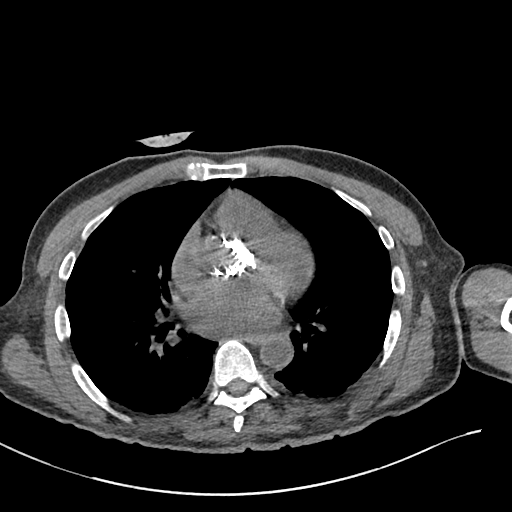
[im 256/311  soft-tissue]
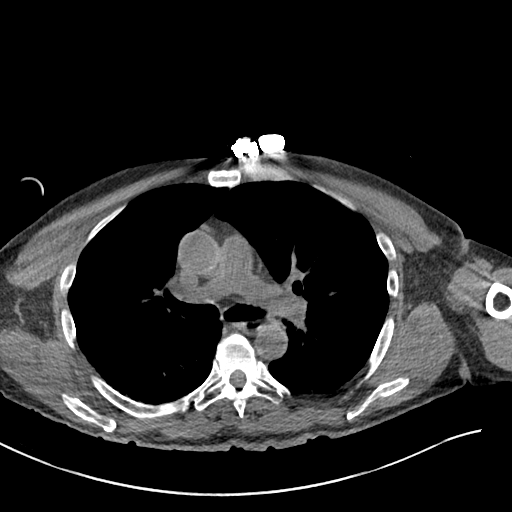
[im 256/311  bone]
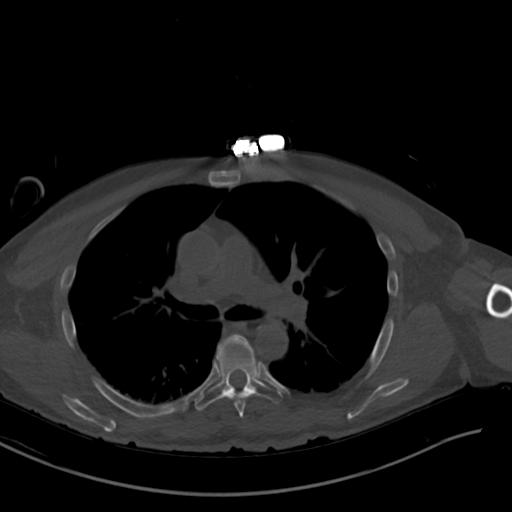
[im 292/311  soft-tissue]
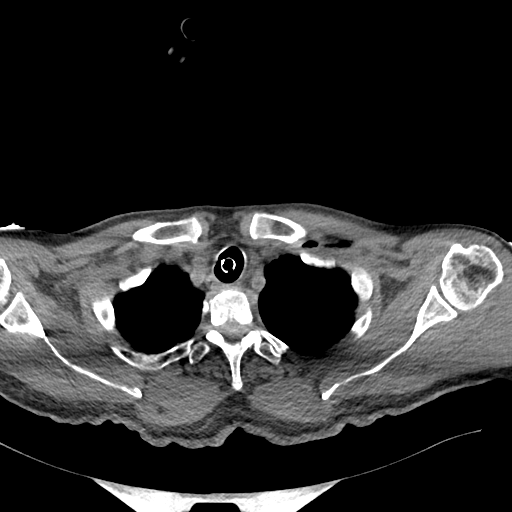

[Series 7: cap w/o 3.0 mm st cor · coronal · non-contrast · 0.68mm/px · 3 of 117 slices shown]
[im 39/117  soft-tissue]
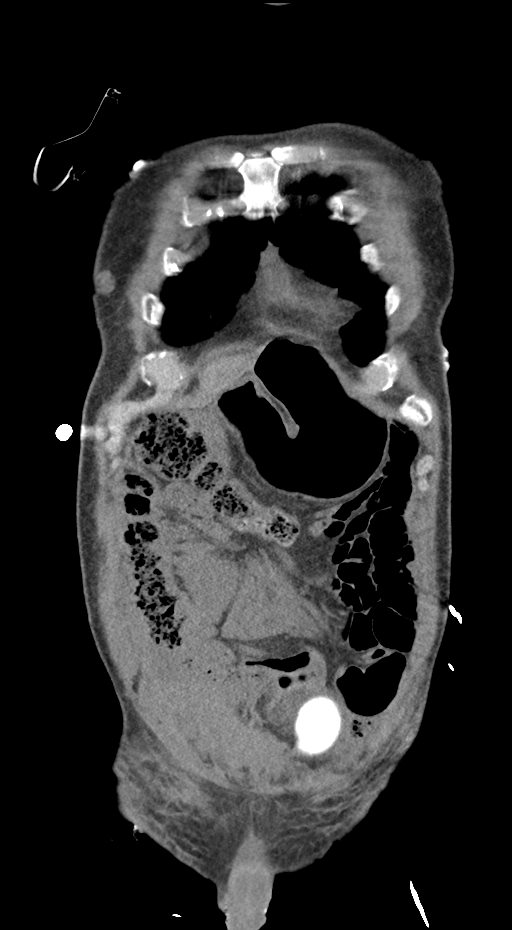
[im 52/117  soft-tissue]
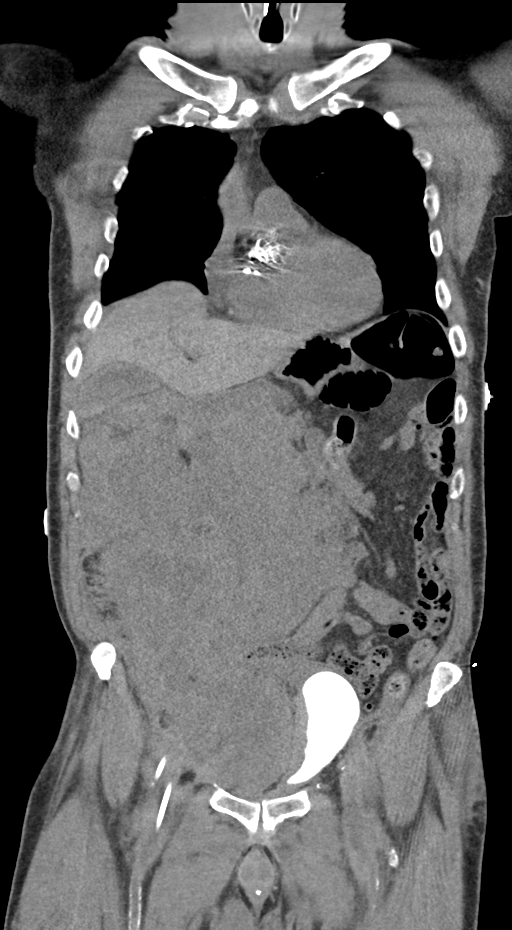
[im 65/117  soft-tissue]
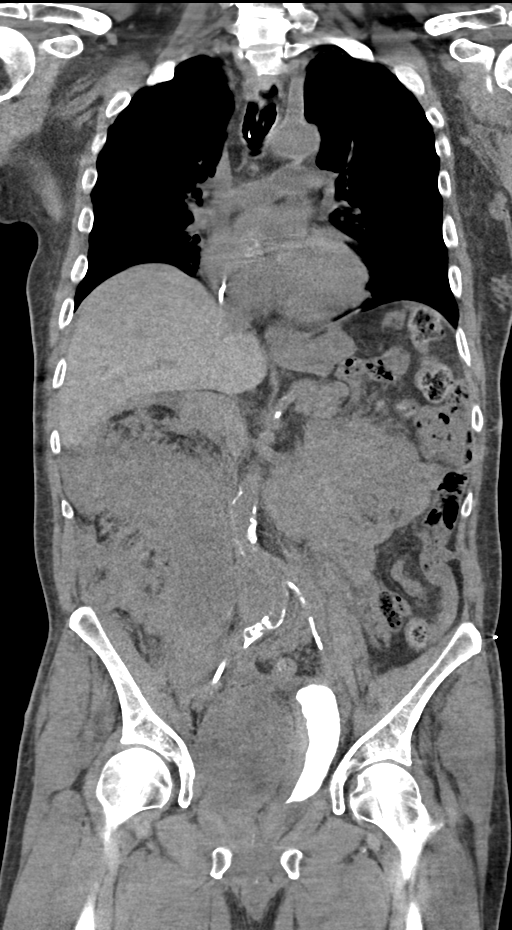

[13 of 46 positions shown; findings below may reference images not displayed]

FINDINGS: CT CHEST FINDINGS

Cardiovascular: Diffuse low density of the blood relative to the
arterial walls. Transcatheter aortic valve. Intracardiac pacemaker
tip in the right ventricular apex, entering from the inferior vena
cava.

Mediastinum/Nodes: Endotracheal tube in the trachea. Small amount of
intravenous air. No enlarged lymph nodes. Unremarkable included
portions of the thyroid gland.

Lungs/Pleura: No significant change in a small number of 2-3 mm
nodules in both lungs. Minimal bilateral dependent atelectasis.

Musculoskeletal: Thoracic and lower cervical spine degenerative
changes.

CT ABDOMEN PELVIS FINDINGS

Hepatobiliary: No focal liver abnormality is seen. No gallstones,
gallbladder wall thickening, or biliary dilatation.

Pancreas: Unremarkable. No pancreatic ductal dilatation or
surrounding inflammatory changes.

Spleen: Normal in size without focal abnormality.

Adrenals/Urinary Tract: The urinary bladder is displaced to the left
with flattening of the bladder by a very large right pelvic
hematoma. The adrenal glands and kidneys are unremarkable. The left
ureters partially visualized and is grossly normal. The right ureter
is also partially visualized due to excreted contrast and the
visualized portions are unremarkable.

Stomach/Bowel: The right abdominal and pelvic bowel loops are
displaced anteriorly and to the left by a very large retroperitoneal
hematoma centered on the right, extending across the midline in the
abdomen. No bowel dilatation is seen.

Vascular/Lymphatic: Atheromatous arterial calcifications. A right
femoral vein catheter is in place with its tip in the mid to lower
pelvis. There is also a right femoral pacemaker lead extending
superiorly through the inferior vena cava to the right atrium.

Reproductive: Prostate is unremarkable.

Other: Very large retroperitoneal hematoma on the right extending
from the inferior pelvis to the liver. This is displacing and
compressing the urinary bladder and displacing the colon and small
bowel, as described above.

Musculoskeletal: Extensive lumbar spine degenerative changes with
straightening of the normal lordosis and mild to moderate
dextroconvex scoliosis.
IMPRESSION: 1. Very large retroperitoneal hematoma on the right extending from
the inferior pelvis to the liver. This extends across the midline to
the left in the abdomen. This is displacing and compressing the
urinary bladder and displacing the small bowel and colon.
2. Diffuse low density of the blood relative to the arterial walls,
compatible with anemia.
3. Stable small number of 2-3 mm nodules in both lungs. These do not
require follow-up.

Critical Value/emergent results were called by telephone at the time
of interpretation on 02/03/2018 at [DATE] to Dr. Shen, who
verbally acknowledged these results.
